# Patient Record
Sex: Female | Born: 1985
Health system: Southern US, Community
[De-identification: ages and names within clinical notes are randomized; demographics above are authoritative.]

## PROBLEM LIST (undated history)

## (undated) DIAGNOSIS — Z789 Other specified health status: Secondary | ICD-10-CM

---

## 2011-04-01 LAB — OB RESULTS CONSOLE ANTIBODY SCREEN: Antibody Screen: NEGATIVE

## 2011-04-01 LAB — OB RESULTS CONSOLE GC/CHLAMYDIA: Chlamydia: NEGATIVE

## 2011-04-01 LAB — OB RESULTS CONSOLE RPR: RPR: NONREACTIVE

## 2011-04-01 LAB — OB RESULTS CONSOLE ABO/RH: RH Type: POSITIVE

## 2011-04-01 LAB — OB RESULTS CONSOLE RUBELLA ANTIBODY, IGM: Rubella: IMMUNE

## 2011-04-08 ENCOUNTER — Other Ambulatory Visit (HOSPITAL_COMMUNITY)
Admission: RE | Admit: 2011-04-08 | Discharge: 2011-04-08 | Disposition: A | Discharge status: Home / Self Care | Payer: Medicaid Other | Source: Ambulatory Visit | Attending: Obstetrics and Gynecology | Admitting: Obstetrics and Gynecology

## 2011-04-08 DIAGNOSIS — Z01419 Encounter for gynecological examination (general) (routine) without abnormal findings: Secondary | ICD-10-CM | POA: Insufficient documentation

## 2011-07-17 LAB — OB RESULTS CONSOLE HIV ANTIBODY (ROUTINE TESTING): HIV: NONREACTIVE

## 2011-09-29 ENCOUNTER — Encounter (HOSPITAL_COMMUNITY): Payer: Self-pay | Admitting: *Deleted

## 2011-09-29 ENCOUNTER — Inpatient Hospital Stay (HOSPITAL_COMMUNITY)
Admission: AD | Admit: 2011-09-29 | Discharge: 2011-10-01 | DRG: 774 | Disposition: A | Discharge status: Home / Self Care | Payer: Medicaid Other | Source: Ambulatory Visit | Attending: Obstetrics & Gynecology | Admitting: Obstetrics & Gynecology

## 2011-09-29 ENCOUNTER — Encounter (HOSPITAL_COMMUNITY): Payer: Self-pay | Admitting: Anesthesiology

## 2011-09-29 ENCOUNTER — Inpatient Hospital Stay (HOSPITAL_COMMUNITY): Payer: Medicaid Other | Admitting: Anesthesiology

## 2011-09-29 HISTORY — DX: Other specified health status: Z78.9

## 2011-09-29 LAB — CBC
HCT: 35.6 % — ABNORMAL LOW (ref 36.0–46.0)
Hemoglobin: 11.9 g/dL — ABNORMAL LOW (ref 12.0–15.0)
MCH: 31.2 pg (ref 26.0–34.0)
MCV: 93.4 fL (ref 78.0–100.0)
RBC: 3.81 MIL/uL — ABNORMAL LOW (ref 3.87–5.11)

## 2011-09-29 MED ORDER — BENZOCAINE-MENTHOL 20-0.5 % EX AERO: 1.0000 "application " | INHALATION_SPRAY | CUTANEOUS | Status: DC | PRN

## 2011-09-29 MED ORDER — OXYTOCIN BOLUS FROM INFUSION
500.0000 mL | Freq: Once | INTRAVENOUS | Status: DC
  Filled 2011-09-29: qty 500
  Filled 2011-09-29 (×2): qty 1000

## 2011-09-29 MED ORDER — OXYTOCIN 20 UNITS IN LACTATED RINGERS INFUSION - SIMPLE: 125.0000 mL/h | Freq: Once | INTRAVENOUS | Status: DC

## 2011-09-29 MED ORDER — OXYCODONE-ACETAMINOPHEN 5-325 MG PO TABS
1.0000 | ORAL_TABLET | ORAL | Status: DC | PRN
  Administered 2011-09-29 – 2011-10-01 (×6): 1 via ORAL
  Filled 2011-09-29 (×6): qty 1

## 2011-09-29 MED ORDER — IBUPROFEN 600 MG PO TABS: 600.0000 mg | ORAL_TABLET | Freq: Four times a day (QID) | ORAL | Status: DC | PRN

## 2011-09-29 MED ORDER — SENNOSIDES-DOCUSATE SODIUM 8.6-50 MG PO TABS
2.0000 | ORAL_TABLET | Freq: Every day | ORAL | Status: DC
  Administered 2011-09-30: 2 via ORAL

## 2011-09-29 MED ORDER — EPHEDRINE 5 MG/ML INJ: 10.0000 mg | INTRAVENOUS | Status: DC | PRN

## 2011-09-29 MED ORDER — LIDOCAINE HCL (PF) 1 % IJ SOLN
30.0000 mL | INTRAMUSCULAR | Status: DC | PRN
  Filled 2011-09-29 (×2): qty 30

## 2011-09-29 MED ORDER — DIPHENHYDRAMINE HCL 50 MG/ML IJ SOLN: 12.5000 mg | INTRAMUSCULAR | Status: DC | PRN

## 2011-09-29 MED ORDER — DIPHENHYDRAMINE HCL 25 MG PO CAPS: 25.0000 mg | ORAL_CAPSULE | Freq: Four times a day (QID) | ORAL | Status: DC | PRN

## 2011-09-29 MED ORDER — PRENATAL MULTIVITAMIN CH
1.0000 | ORAL_TABLET | Freq: Every day | ORAL | Status: DC
  Administered 2011-09-29 – 2011-10-01 (×3): 1 via ORAL
  Filled 2011-09-29 (×3): qty 1

## 2011-09-29 MED ORDER — FLEET ENEMA 7-19 GM/118ML RE ENEM: 1.0000 | ENEMA | RECTAL | Status: DC | PRN

## 2011-09-29 MED ORDER — EPHEDRINE 5 MG/ML INJ
10.0000 mg | INTRAVENOUS | Status: DC | PRN
  Filled 2011-09-29: qty 4

## 2011-09-29 MED ORDER — BUTORPHANOL TARTRATE 2 MG/ML IJ SOLN: 1.0000 mg | INTRAMUSCULAR | Status: DC | PRN

## 2011-09-29 MED ORDER — SIMETHICONE 80 MG PO CHEW: 80.0000 mg | CHEWABLE_TABLET | ORAL | Status: DC | PRN

## 2011-09-29 MED ORDER — LACTATED RINGERS IV SOLN: INTRAVENOUS | Status: DC

## 2011-09-29 MED ORDER — LACTATED RINGERS IV SOLN: 500.0000 mL | INTRAVENOUS | Status: DC | PRN

## 2011-09-29 MED ORDER — LACTATED RINGERS IV SOLN: 500.0000 mL | Freq: Once | INTRAVENOUS | Status: DC

## 2011-09-29 MED ORDER — OXYCODONE-ACETAMINOPHEN 5-325 MG PO TABS: 1.0000 | ORAL_TABLET | ORAL | Status: DC | PRN

## 2011-09-29 MED ORDER — METHYLERGONOVINE MALEATE 0.2 MG/ML IJ SOLN: 0.2000 mg | INTRAMUSCULAR | Status: DC | PRN

## 2011-09-29 MED ORDER — ACETAMINOPHEN 325 MG PO TABS: 650.0000 mg | ORAL_TABLET | ORAL | Status: DC | PRN

## 2011-09-29 MED ORDER — PHENYLEPHRINE 40 MCG/ML (10ML) SYRINGE FOR IV PUSH (FOR BLOOD PRESSURE SUPPORT): 80.0000 ug | PREFILLED_SYRINGE | INTRAVENOUS | Status: DC | PRN

## 2011-09-29 MED ORDER — ONDANSETRON HCL 4 MG/2ML IJ SOLN: 4.0000 mg | Freq: Four times a day (QID) | INTRAMUSCULAR | Status: DC | PRN

## 2011-09-29 MED ORDER — METHYLERGONOVINE MALEATE 0.2 MG PO TABS: 0.2000 mg | ORAL_TABLET | ORAL | Status: DC | PRN

## 2011-09-29 MED ORDER — PHENYLEPHRINE 40 MCG/ML (10ML) SYRINGE FOR IV PUSH (FOR BLOOD PRESSURE SUPPORT)
80.0000 ug | PREFILLED_SYRINGE | INTRAVENOUS | Status: DC | PRN
  Filled 2011-09-29: qty 5

## 2011-09-29 MED ORDER — DIBUCAINE 1 % RE OINT: 1.0000 "application " | TOPICAL_OINTMENT | RECTAL | Status: DC | PRN

## 2011-09-29 MED ORDER — LIDOCAINE HCL (PF) 1 % IJ SOLN
INTRAMUSCULAR | Status: DC | PRN
  Administered 2011-09-29 (×3): 4 mL

## 2011-09-29 MED ORDER — CITRIC ACID-SODIUM CITRATE 334-500 MG/5ML PO SOLN: 30.0000 mL | ORAL | Status: DC | PRN

## 2011-09-29 MED ORDER — ZOLPIDEM TARTRATE 5 MG PO TABS: 5.0000 mg | ORAL_TABLET | Freq: Every evening | ORAL | Status: DC | PRN

## 2011-09-29 MED ORDER — TETANUS-DIPHTH-ACELL PERTUSSIS 5-2.5-18.5 LF-MCG/0.5 IM SUSP
0.5000 mL | Freq: Once | INTRAMUSCULAR | Status: AC
  Administered 2011-09-30: 0.5 mL via INTRAMUSCULAR
  Filled 2011-09-29: qty 0.5

## 2011-09-29 MED ORDER — LANOLIN HYDROUS EX OINT: TOPICAL_OINTMENT | CUTANEOUS | Status: DC | PRN

## 2011-09-29 MED ORDER — FENTANYL 2.5 MCG/ML BUPIVACAINE 1/10 % EPIDURAL INFUSION (WH - ANES)
14.0000 mL/h | INTRAMUSCULAR | Status: DC
  Administered 2011-09-29: 14 mL/h via EPIDURAL
  Filled 2011-09-29: qty 60

## 2011-09-29 MED ORDER — ONDANSETRON HCL 4 MG/2ML IJ SOLN: 4.0000 mg | INTRAMUSCULAR | Status: DC | PRN

## 2011-09-29 MED ORDER — IBUPROFEN 600 MG PO TABS
600.0000 mg | ORAL_TABLET | Freq: Four times a day (QID) | ORAL | Status: DC
  Administered 2011-09-29 – 2011-10-01 (×9): 600 mg via ORAL
  Filled 2011-09-29 (×9): qty 1

## 2011-09-29 MED ORDER — WITCH HAZEL-GLYCERIN EX PADS: 1.0000 "application " | MEDICATED_PAD | CUTANEOUS | Status: DC | PRN

## 2011-09-29 MED ORDER — ONDANSETRON HCL 4 MG PO TABS: 4.0000 mg | ORAL_TABLET | ORAL | Status: DC | PRN

## 2011-09-29 NOTE — Anesthesia Procedure Notes (Signed)
Epidural Patient location during procedure: OB Start time: 09/29/2011 5:15 AM Reason for block: procedure for pain  Staffing Performed by: anesthesiologist   Preanesthetic Checklist Completed: patient identified, site marked, surgical consent, pre-op evaluation, timeout performed, IV checked, risks and benefits discussed and monitors and equipment checked  Epidural Patient position: sitting Prep: site prepped and draped and DuraPrep Patient monitoring: continuous pulse ox and blood pressure Approach: midline Injection technique: LOR air  Needle:  Needle type: Tuohy  Needle gauge: 17 G Needle length: 9 cm Needle insertion depth: 7 cm Catheter type: closed end flexible Catheter size: 19 Gauge Catheter at skin depth: 12 cm Test dose: negative  Assessment Events: blood not aspirated, injection not painful, no injection resistance, negative IV test and no paresthesia  Additional Notes Discussed risk of headache, infection, bleeding, nerve injury and failed or incomplete block.  Patient voices understanding and wishes to proceed.

## 2011-09-29 NOTE — Anesthesia Preprocedure Evaluation (Signed)

## 2011-09-29 NOTE — Brief Op Note (Addendum)
Delivery Note At 7:08 AM a viable, healthy and  Caitlin Nichols female was delivered via Vaginal, Spontaneous Delivery (Presentation: Middle Occiput Anterior).  APGAR: 5, 7; weight 6 lb 4 oz (2835 g).   Placenta status: Intact, Expressed.as the cord avulsed and required manual removal and this was done easily under the CLE - tolerated well- in spite of the fact that the uterus involuted immediately  Cord: 3 vessels with the following complications: None.  Cord pH: pending  Anesthesia: Epidural  Episiotomy: no Lacerations: abrasion of the left periurethral area Suture Repair: n/a Est. Blood Loss (mL): 300  Mom to postpartum.  Baby to with mom.  [name] H. 09/29/2011, 7:53 AM     The neonatal team was called to the delivery per Dr Eric Form and without intervention the baby started breathing on its own

## 2011-09-29 NOTE — MAU Note (Signed)
Pt arrives via wheelchair, very uncomfortable.

## 2011-09-29 NOTE — H&P (Signed)
Caitlin Nichols is a 26 y.o. female presenting for active labor and I checked her and she was anterior lip and 100% effaced and 0 station and AROM revealed clear fluid Maternal Medical History:  Reason for admission: Reason for admission: contractions.  Contractions: Onset was 3-5 hours ago.   Frequency: regular.   Duration is approximately 50 seconds.   Perceived severity is strong.    Fetal activity: Perceived fetal activity is normal.   Last perceived fetal movement was within the past 12 hours.    Prenatal complications: no prenatal complications Prenatal Complications - Diabetes: none.    OB History    Grav Para Term Preterm Abortions TAB SAB Ect Mult Living   2 1 1  0 0 0 0 0 0 1     Past Medical History  Diagnosis Date  . No pertinent past medical history    History reviewed. No pertinent past surgical history. Family History: family history is not on file. Social History:  reports that she has never smoked. She has never used smokeless tobacco. She reports that she does not drink alcohol or use illicit drugs.  Review of Systems  Constitutional: Negative.  Negative for fever and chills.  HENT: Negative.   Eyes: Negative.   Respiratory: Negative.  Negative for shortness of breath.   Cardiovascular: Negative.  Negative for chest pain and palpitations.  Gastrointestinal: Negative.  Negative for vomiting and diarrhea.  Genitourinary: Negative.   Musculoskeletal: Negative.  Negative for joint pain.  Skin: Negative.   Neurological: Negative.  Negative for headaches.  Endo/Heme/Allergies: Negative.   Psychiatric/Behavioral: Negative.     Dilation: Lip/rim Effacement (%): 100 Station: +1;0 Exam by:: DuPont, RN  Blood pressure 113/65, pulse 67, temperature 97.5 F (36.4 C), temperature source Oral, resp. rate 18, height 5\' 6"  (1.676 m), weight 95.255 kg (210 lb). Maternal Exam:  Uterine Assessment: Contraction strength is firm.  Contraction duration is 45 seconds.  Contraction frequency is regular.   Abdomen: Patient reports no abdominal tenderness. Fundal height is term.   Estimated fetal weight is 7#.   Fetal presentation: vertex  Introitus: Normal vulva. Normal vagina.  Ferning test: not done.  Nitrazine test: not done. Amniotic fluid character: clear.  Pelvis: adequate for delivery.      Physical Exam  Vitals reviewed. Constitutional: She is oriented to person, place, and time. She appears well-developed and well-nourished.  HENT:  Head: Normocephalic and atraumatic.  Mouth/Throat: Oropharynx is clear and moist.  Eyes: Pupils are equal, round, and reactive to light. Right eye exhibits no discharge. Left eye exhibits no discharge.  Neck: Normal range of motion. Neck supple.  Cardiovascular: Normal rate and regular rhythm.   Respiratory: Effort normal and breath sounds normal.  GI: Soft. Bowel sounds are normal.  Genitourinary: Vagina normal and uterus normal.  Musculoskeletal: Normal range of motion. She exhibits no edema.  Neurological: She is alert and oriented to person, place, and time. She has normal reflexes.  Skin: Skin is warm.  Psychiatric: She has a normal mood and affect. Her behavior is normal. Thought content normal.    Prenatal labs: ABO, Rh: O/Positive/-- (10/23 0000) Antibody: Negative (10/23 0000) Rubella: Immune (10/23 0000) RPR: Nonreactive (02/07 0000)  HBsAg: Negative (10/23 0000)  HIV: Non-reactive (02/07 0000)  GBS: Negative (03/28 0000)   Assessment/Plan: IUP term in labor39 weeks  expectant management Shaun Runyon H. 09/29/2011, 6:55 AM

## 2011-09-29 NOTE — Anesthesia Postprocedure Evaluation (Signed)
  Anesthesia Post-op Note  Patient: Caitlin Nichols  Procedure(s) Performed: * No procedures listed *  Patient Location: 130  Anesthesia Type: Epidural  Level of Consciousness: awake, alert  and oriented  Airway and Oxygen Therapy: Patient Spontanous Breathing  Post-op Pain: none  Post-op Assessment: Post-op Vital signs reviewed, Patient's Cardiovascular Status Stable, No headache, No backache, No residual numbness and No residual motor weakness  Post-op Vital Signs: Reviewed and stable  Complications: No apparent anesthesia complications

## 2011-09-30 ENCOUNTER — Encounter (HOSPITAL_COMMUNITY): Payer: Self-pay | Admitting: *Deleted

## 2011-09-30 NOTE — Progress Notes (Signed)
UR Chart review completed.  

## 2011-09-30 NOTE — Progress Notes (Signed)
Post Partum Day 1 Subjective: no complaints, up ad lib, voiding and tolerating PO  Objective: Blood pressure 122/83, pulse 76, temperature 98.3 F (36.8 C), temperature source Oral, resp. rate 20, height 5\' 6"  (1.676 m), weight 95.255 kg (210 lb).  Physical Exam:  General: alert, cooperative and no distress Lochia: appropriate Uterine Fundus: firm Incision: n/A DVT Evaluation: No evidence of DVT seen on physical exam. Negative Homan's sign. No cords or calf tenderness.   Basename 09/29/11 0455  HGB 11.9*  HCT 35.6*    Assessment/Plan: Plan for discharge tomorrow   LOS: 1 day   Caitlin Nichols H. 09/30/2011, 8:23 AM

## 2011-10-01 NOTE — Progress Notes (Signed)
Post Partum Day2 Subjective: no complaints, up ad lib, voiding and tolerating PO Not breast feeding Objective: Blood pressure 113/75, pulse 61, temperature 98.6 F (37 C), temperature source Oral, resp. rate 18, height 5\' 6"  (1.676 m), weight 95.255 kg (210 lb), SpO2 99.00%, unknown if currently breastfeeding.  Physical Exam:  General: alert, cooperative and no distress Lochia: appropriate Uterine Fundus: firm Incision: n/a DVT Evaluation: No evidence of DVT seen on physical exam. Negative Homan's sign. No cords or calf tenderness. No significant calf/ankle edema.   Basename 09/29/11 0455  HGB 11.9*  HCT 35.6*    Assessment/Plan: Discharge home   LOS: 2 days  F/u 2 weeks Caitlin Nichols H. 10/01/2011, 11:37 AM

## 2011-10-01 NOTE — Discharge Summary (Signed)
Obstetric Discharge Summary Reason for Admission: onset of labor Prenatal Procedures: none Intrapartum Procedures: spontaneous vaginal delivery Postpartum Procedures: none Complications-Operative and Postpartum: none there was manual removal of the placenta Hemoglobin  Date Value Range Status  09/29/2011 11.9* 12.0-15.0 (g/dL) Final     HCT  Date Value Range Status  09/29/2011 35.6* 36.0-46.0 (%) Final      Discharge Diagnoses: Term Pregnancy-delivered  Discharge Information: Date: 10/01/2011 Activity: unrestricted Diet: routine Medications: None and pt may take tylenol and ibuprofen or motrin prn Condition: stable Instructions: refer to practice specific booklet Discharge to: home   Newborn Data: Live born female  Birth Weight: 6 lb 4 oz (2835 g) APGAR: 5, 7 Post Partum Day2 Subjective: no complaints, up ad lib, voiding and tolerating PO Not breast feeding Objective: Blood pressure 113/75, pulse 61, temperature 98.6 F (37 C), temperature source Oral, resp. rate 18, height 5\' 6"  (1.676 m), weight 95.255 kg (210 lb), SpO2 99.00%, unknown if currently breastfeeding.  Physical Exam:  General: alert, cooperative and no distress Lochia: appropriate Uterine Fundus: firm Incision: n/a DVT Evaluation: No evidence of DVT seen on physical exam. Negative Homan's sign. No cords or calf tenderness. No significant calf/ankle edema.   Basename 09/29/11 0455  HGB 11.9*  HCT 35.6*    Assessment/Plan: Discharge home   LOS: 2 days  F/u 2 weeks Avrianna Smart H. 10/01/2011, 11:37 AM      Home with mother.  Alishba Naples H. 10/01/2011, 11:39 AM

## 2011-11-13 ENCOUNTER — Other Ambulatory Visit (HOSPITAL_COMMUNITY)
Admission: RE | Admit: 2011-11-13 | Discharge: 2011-11-13 | Disposition: A | Discharge status: Home / Self Care | Payer: Medicaid Other | Source: Ambulatory Visit | Attending: Obstetrics & Gynecology | Admitting: Obstetrics & Gynecology

## 2011-11-13 DIAGNOSIS — Z124 Encounter for screening for malignant neoplasm of cervix: Secondary | ICD-10-CM | POA: Insufficient documentation

## 2014-01-27 ENCOUNTER — Emergency Department (HOSPITAL_BASED_OUTPATIENT_CLINIC_OR_DEPARTMENT_OTHER): Payer: Medicaid Other

## 2014-01-27 ENCOUNTER — Encounter (HOSPITAL_BASED_OUTPATIENT_CLINIC_OR_DEPARTMENT_OTHER): Payer: Self-pay | Admitting: Emergency Medicine

## 2014-01-27 ENCOUNTER — Emergency Department (HOSPITAL_BASED_OUTPATIENT_CLINIC_OR_DEPARTMENT_OTHER)
Admission: EM | Admit: 2014-01-27 | Discharge: 2014-01-27 | Disposition: A | Discharge status: Home / Self Care | Payer: Medicaid Other | Attending: Emergency Medicine | Admitting: Emergency Medicine

## 2014-01-27 DIAGNOSIS — R05 Cough: Secondary | ICD-10-CM | POA: Insufficient documentation

## 2014-01-27 DIAGNOSIS — R000 Tachycardia, unspecified: Secondary | ICD-10-CM | POA: Insufficient documentation

## 2014-01-27 DIAGNOSIS — R059 Cough, unspecified: Secondary | ICD-10-CM | POA: Insufficient documentation

## 2014-01-27 DIAGNOSIS — J159 Unspecified bacterial pneumonia: Secondary | ICD-10-CM | POA: Insufficient documentation

## 2014-01-27 DIAGNOSIS — J189 Pneumonia, unspecified organism: Secondary | ICD-10-CM

## 2014-01-27 DIAGNOSIS — Z3202 Encounter for pregnancy test, result negative: Secondary | ICD-10-CM | POA: Insufficient documentation

## 2014-01-27 LAB — PREGNANCY, URINE: Preg Test, Ur: NEGATIVE

## 2014-01-27 MED ORDER — AZITHROMYCIN 250 MG PO TABS: ORAL_TABLET | ORAL | Status: DC

## 2014-01-27 MED ORDER — ACETAMINOPHEN 500 MG PO TABS
1000.0000 mg | ORAL_TABLET | Freq: Once | ORAL | Status: AC
  Administered 2014-01-27: 1000 mg via ORAL
  Filled 2014-01-27: qty 2

## 2014-01-27 MED ORDER — ONDANSETRON 4 MG PO TBDP
4.0000 mg | ORAL_TABLET | Freq: Once | ORAL | Status: AC
  Administered 2014-01-27: 4 mg via ORAL
  Filled 2014-01-27: qty 1

## 2014-01-27 NOTE — Discharge Instructions (Signed)

## 2014-01-27 NOTE — ED Notes (Signed)
MD at bedside. 

## 2014-01-27 NOTE — ED Notes (Signed)
C/o fever, chills, cough, sweats, headache x 1 week

## 2014-01-27 NOTE — ED Provider Notes (Signed)
CSN: [id number]     Arrival date & time 01/27/14  1140 History   First MD Initiated Contact with Patient 01/27/14 1157     Chief Complaint  Patient presents with  . Cough     (Consider location/radiation/quality/duration/timing/severity/associated sxs/prior Treatment) Patient is a 28 y.o. female presenting with cough.  Cough Cough characteristics:  Productive Sputum characteristics:  Yellow Severity:  Moderate Onset quality:  Gradual Duration:  1 week Timing:  Constant Progression:  Worsening Chronicity:  New Smoker: yes   Context: upper respiratory infection   Relieved by:  Nothing Worsened by:  Nothing tried Ineffective treatments:  None tried Associated symptoms: chills, fever and shortness of breath   Associated symptoms: no chest pain, no rash, no rhinorrhea and no sore throat     Past Medical History  Diagnosis Date  . No pertinent past medical history    History reviewed. No pertinent past surgical history. No family history on file. History  Substance Use Topics  . Smoking status: Never Smoker   . Smokeless tobacco: Never Used  . Alcohol Use: No   OB History   Grav Para Term Preterm Abortions TAB SAB Ect Mult Living   2 2 2  0 0 0 0 0 0 2     Review of Systems  Constitutional: Positive for fever and chills.  HENT: Negative for congestion, rhinorrhea and sore throat.   Eyes: Negative for photophobia and visual disturbance.  Respiratory: Positive for cough and shortness of breath.   Cardiovascular: Negative for chest pain and leg swelling.  Gastrointestinal: Negative for nausea, vomiting, abdominal pain, diarrhea and constipation.  Endocrine: Negative for polyphagia and polyuria.  Genitourinary: Negative for dysuria, flank pain, vaginal bleeding, vaginal discharge and enuresis.  Musculoskeletal: Negative for back pain and gait problem.  Skin: Negative for color change and rash.  Neurological: Negative for dizziness, syncope, light-headedness and numbness.   Hematological: Negative for adenopathy. Does not bruise/bleed easily.  All other systems reviewed and are negative.     Allergies  Review of patient's allergies indicates no known allergies.  Home Medications   Prior to Admission medications   Medication Sig Start Date End Date Taking? Authorizing Provider  azithromycin (ZITHROMAX Z-PAK) 250 MG tablet Take 2 tablets by mouth on day 1 followed by 1 pill by mouth daily for 4 days for a total of 5 days 01/27/14   Mirian Mo, MD   BP 120/68  Pulse 100  Temp(Src) 98.4 F (36.9 C) (Oral)  Resp 16  Ht 5\' 8"  (1.727 m)  Wt 180 lb (81.647 kg)  BMI 27.38 kg/m2  SpO2 98%  LMP 01/08/2014 Physical Exam  Vitals reviewed. Constitutional: She is oriented to person, place, and time. She appears well-developed and well-nourished.  HENT:  Head: Normocephalic and atraumatic.  Right Ear: External ear normal.  Left Ear: External ear normal.  Eyes: Conjunctivae and EOM are normal. Pupils are equal, round, and reactive to light.  Neck: Normal range of motion. Neck supple.  Cardiovascular: Normal rate, regular rhythm, normal heart sounds and intact distal pulses.   Pulmonary/Chest: Effort normal. She has rales in the right lower field and the left lower field.  Abdominal: Soft. Bowel sounds are normal. There is no tenderness.  Musculoskeletal: Normal range of motion.  Neurological: She is alert and oriented to person, place, and time.  Skin: Skin is warm and dry.    ED Course  Procedures (including critical care time) Labs Review Labs Reviewed  PREGNANCY, URINE    Imaging  Review Dg Chest 2 View  01/27/2014   CLINICAL DATA:  Persistent cough  EXAM: CHEST  2 VIEW  COMPARISON:  None.  FINDINGS: Normal cardiac silhouette. There is a right lung lobe opacity projecting over the cardiophrenic angle which projects over the right middle lobe on lateral projection. No pleural fluid. No pneumothorax.  IMPRESSION: Right middle lobe pneumonia.    Electronically Signed   By: Genevive BiStewart  Edmunds M.D.   On: 01/27/2014 12:32     EKG Interpretation None      MDM   Final diagnoses:  Community acquired pneumonia    28 y.o. female  without pertinent PMH presents with cough, subjective fever times one week.  Patient is a productive yellow sputum. On arrival the patient is very mildly tachycardic on my examination and has a physical exam and other vitals as above. Chest x-ray obtained and was positive for right middle lobe pneumonia. Patient tolerated by mouth and not requiring oxygen, subcutaneous her stable to discharge home with outpatient therapy for pneumonia. She was given standard return precautions, voiced understanding, and will followup.    Labs and imaging as above reviewed.   1. Community acquired pneumonia         Mirian MoMatthew Gentry, MD 01/27/14 1326

## 2014-04-10 ENCOUNTER — Encounter (HOSPITAL_BASED_OUTPATIENT_CLINIC_OR_DEPARTMENT_OTHER): Payer: Self-pay | Admitting: Emergency Medicine

## 2017-03-02 ENCOUNTER — Emergency Department (HOSPITAL_BASED_OUTPATIENT_CLINIC_OR_DEPARTMENT_OTHER)
Admission: EM | Admit: 2017-03-02 | Discharge: 2017-03-02 | Disposition: A | Discharge status: Home / Self Care | Payer: No Typology Code available for payment source | Attending: Emergency Medicine | Admitting: Emergency Medicine

## 2017-03-02 ENCOUNTER — Encounter (HOSPITAL_BASED_OUTPATIENT_CLINIC_OR_DEPARTMENT_OTHER): Payer: Self-pay

## 2017-03-02 ENCOUNTER — Emergency Department (HOSPITAL_BASED_OUTPATIENT_CLINIC_OR_DEPARTMENT_OTHER): Payer: No Typology Code available for payment source

## 2017-03-02 DIAGNOSIS — S161XXA Strain of muscle, fascia and tendon at neck level, initial encounter: Secondary | ICD-10-CM | POA: Insufficient documentation

## 2017-03-02 DIAGNOSIS — Y92481 Parking lot as the place of occurrence of the external cause: Secondary | ICD-10-CM | POA: Insufficient documentation

## 2017-03-02 DIAGNOSIS — Y999 Unspecified external cause status: Secondary | ICD-10-CM | POA: Diagnosis not present

## 2017-03-02 DIAGNOSIS — Y9389 Activity, other specified: Secondary | ICD-10-CM | POA: Insufficient documentation

## 2017-03-02 DIAGNOSIS — S199XXA Unspecified injury of neck, initial encounter: Secondary | ICD-10-CM | POA: Diagnosis present

## 2017-03-02 MED ORDER — CYCLOBENZAPRINE HCL 10 MG PO TABS: 10.0000 mg | ORAL_TABLET | Freq: Every day | ORAL | 0 refills | Status: DC

## 2017-03-02 MED ORDER — TRAMADOL HCL 50 MG PO TABS: 50.0000 mg | ORAL_TABLET | Freq: Four times a day (QID) | ORAL | 0 refills | Status: DC | PRN

## 2017-03-02 MED ORDER — IBUPROFEN 800 MG PO TABS: 800.0000 mg | ORAL_TABLET | Freq: Three times a day (TID) | ORAL | 0 refills | Status: DC | PRN
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
## 2017-03-02 NOTE — Discharge Instructions (Signed)
Return here as needed. Follow up with a primary doctor. Use ice and heat on your neck and shoulders. Your x-rays did not show any significant abnormalities
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
## 2018-12-27 ENCOUNTER — Encounter (HOSPITAL_BASED_OUTPATIENT_CLINIC_OR_DEPARTMENT_OTHER): Payer: Self-pay

## 2018-12-27 ENCOUNTER — Emergency Department (HOSPITAL_BASED_OUTPATIENT_CLINIC_OR_DEPARTMENT_OTHER)
Admission: EM | Admit: 2018-12-27 | Discharge: 2018-12-27 | Disposition: A | Discharge status: Home / Self Care | Payer: Self-pay | Attending: Emergency Medicine | Admitting: Emergency Medicine

## 2018-12-27 ENCOUNTER — Other Ambulatory Visit: Payer: Self-pay

## 2018-12-27 DIAGNOSIS — G501 Atypical facial pain: Secondary | ICD-10-CM | POA: Insufficient documentation

## 2018-12-27 DIAGNOSIS — R220 Localized swelling, mass and lump, head: Secondary | ICD-10-CM | POA: Insufficient documentation

## 2018-12-27 DIAGNOSIS — F17210 Nicotine dependence, cigarettes, uncomplicated: Secondary | ICD-10-CM | POA: Insufficient documentation

## 2018-12-27 LAB — GROUP A STREP BY PCR: Group A Strep by PCR: NOT DETECTED

## 2018-12-27 MED ORDER — AMOXICILLIN 500 MG PO CAPS
500.0000 mg | ORAL_CAPSULE | Freq: Three times a day (TID) | ORAL | 0 refills | Status: DC
Start: 2018-12-27 — End: 2023-12-18

## 2018-12-27 MED FILL — AMOXICILLIN 500 MG CAPSULE: 500 | 7 days supply | Qty: 21 | Fill #0

## 2018-12-27 NOTE — ED Triage Notes (Addendum)
Pt c/o sore throat and feels right side/jaw is "locking" x today-NAD-steady gait

## 2018-12-27 NOTE — ED Provider Notes (Signed)
[facility] EMERGENCY DEPARTMENT Provider Note   CSN: [id number] Arrival date & time: 12/27/18  1423    History   Chief Complaint Chief Complaint  Patient presents with  . Sore Throat    HPI Caitlin Nichols is a 33 y.o. female with no significant pertinent past medical history who presents today for evaluation of right-sided throat/face pain.  She reports that this morning when she woke up she had pain on the right side.  She reports pain with swallowing however denies difficulty swallowing or breathing.  No fevers.  No sick contacts.  She denies chest pain, cough, or shortness of breath.  She reports that she does have a broken right mandibular third molar, is supposed to get it removed by her dentist in approximately 3 to 4 weeks.     HPI  Past Medical History:  Diagnosis Date  . No pertinent past medical history     There are no active problems to display for this patient.   History reviewed. No pertinent surgical history.   OB History    Gravida  2   Para  2   Term  2   Preterm  0   AB  0   Living  2     SAB  0   TAB  0   Ectopic  0   Multiple  0   Live Births  2            Home Medications    Prior to Admission medications   Medication Sig Start Date End Date Taking? Authorizing Provider  amoxicillin (AMOXIL) 500 MG capsule Take 1 capsule (500 mg total) by mouth 3 (three) times daily. 12/27/18   Cristina GongHammond, Natallie Ravenscroft W, PA-C  ibuprofen (ADVIL,MOTRIN) 800 MG tablet Take 1 tablet (800 mg total) by mouth every 8 (eight) hours as needed. 03/02/17   Charlestine NightLawyer, Christopher, PA-C    Family History No family history on file.  Social History Social History   Tobacco Use  . Smoking status: Current Every Day Smoker    Types: Cigarettes  . Smokeless tobacco: Never Used  Substance Use Topics  . Alcohol use: Yes    Comment: weekly  . Drug use: No     Allergies   Patient has no known allergies.   Review of Systems Review of Systems   Constitutional: Negative for chills and fever.  HENT: Positive for dental problem, facial swelling and sore throat. Negative for drooling, postnasal drip, rhinorrhea, sinus pressure, sinus pain, trouble swallowing and voice change.   Respiratory: Negative for chest tightness and shortness of breath.   Neurological: Negative for facial asymmetry.  All other systems reviewed and are negative.    Physical Exam Updated Vital Signs BP 119/78 (BP Location: Left Arm)   Pulse (!) 103   Temp 98.3 F (36.8 C) (Oral)   Resp 18   Ht 5\' 9"  (1.753 m)   Wt 75.3 kg   LMP 12/11/2018   SpO2 100%   BMI 24.51 kg/m   Physical Exam Vitals signs and nursing note reviewed.  Constitutional:      General: She is not in acute distress.    Appearance: She is not ill-appearing.  HENT:     Head: Normocephalic.     Comments: There is mild edema along the angle of the mandible.  She has a broken third mandibular molar without obvious intraoral swelling or dental abscess.  She does not have significant swelling under the jaw or elevation of the  floor the mouth.  Voice is not muffled.  There is no trismus or pain with opening and closing mouth.    Right Ear: Tympanic membrane and ear canal normal.     Left Ear: Tympanic membrane and ear canal normal.     Nose: No congestion.     Mouth/Throat:     Mouth: Mucous membranes are moist.     Pharynx: Uvula midline. No pharyngeal swelling.     Tonsils: No tonsillar exudate or tonsillar abscesses. 1+ on the right. 1+ on the left.  Eyes:     Conjunctiva/sclera: Conjunctivae normal.  Neck:     Musculoskeletal: Normal range of motion and neck supple.  Cardiovascular:     Rate and Rhythm: Normal rate.  Pulmonary:     Effort: Pulmonary effort is normal. No respiratory distress.  Lymphadenopathy:     Cervical: No cervical adenopathy.  Skin:    General: Skin is warm and dry.  Neurological:     Mental Status: She is alert.  Psychiatric:        Mood and Affect:  Mood normal.        Behavior: Behavior normal.      ED Treatments / Results  Labs (all labs ordered are listed, but only abnormal results are displayed) Labs Reviewed  GROUP A STREP BY PCR    EKG None  Radiology No results found.  Procedures Procedures (including critical care time)  Medications Ordered in ED Medications - No data to display   Initial Impression / Assessment and Plan / ED Course  I have reviewed the triage vital signs and the nursing notes.  Pertinent labs & imaging results that were available during my care of the patient were reviewed by me and considered in my medical decision making (see chart for details).  Clinical Course as of Dec 27 1546  Mon Dec 27, 2018  1538 HR 75   [EH]    Clinical Course User Index [EH] Lorin Glass, [place]      Patient presents today for evaluation of right-sided facial pain and swelling along with pain with swallowing.  Rapid strep was obtained which was negative.  No evidence of tonsillitis or tonsillar abscess.  She has a broken third mandibular molar and mild facial swelling without evidence of deep spread of infection or Ludwig's angina.  She is able to swallow without difficulty.  She has a Pharmacist, community.  Recommended dental follow-up.  We will give prescription for amoxicillin in addition to instructions on OTC pain medicine.  She denies possibility of pregnancy.   Return precautions were discussed with patient who states their understanding.  At the time of discharge patient denied any unaddressed complaints or concerns.  Patient is agreeable for discharge home.   Final Clinical Impressions(s) / ED Diagnoses   Final diagnoses:  Facial swelling    ED Discharge Orders         Ordered    amoxicillin (AMOXIL) 500 MG capsule  3 times daily     12/27/18 1542           Lorin Glass, [place] 12/27/18 1548    Malvin Johns, MD 12/27/18 2244

## 2018-12-27 NOTE — Discharge Instructions (Addendum)
You may have diarrhea from the antibiotics.  It is very important that you continue to take the antibiotics even if you get diarrhea unless a medical professional tells you that you may stop taking them.  If you stop too early the bacteria you are being treated for will become stronger and you may need different, more powerful antibiotics that have more side effects and worsening diarrhea.  Please stay well hydrated and consider probiotics as they may decrease the severity of your diarrhea.  Please be aware that if you take any hormonal contraception (birth control pills, nexplanon, the ring, etc) that your birth control will not work while you are taking antibiotics and you need to use back up protection as directed on the birth control medication information insert.  ° °Please take Ibuprofen (Advil, motrin) and Tylenol (acetaminophen) to relieve your pain.  You may take up to 600 MG (3 pills) of normal strength ibuprofen every 8 hours as needed.  In between doses of ibuprofen you make take tylenol, up to 1,000 mg (two extra strength pills).  Do not take more than 3,000 mg tylenol in a 24 hour period.  Please check all medication labels as many medications such as pain and cold medications may contain tylenol.  Do not drink alcohol while taking these medications.  Do not take other NSAID'S while taking ibuprofen (such as aleve or naproxen).  Please take ibuprofen with food to decrease stomach upset. ° °

## 2020-03-27 ENCOUNTER — Other Ambulatory Visit: Payer: Self-pay

## 2020-03-27 ENCOUNTER — Encounter (HOSPITAL_BASED_OUTPATIENT_CLINIC_OR_DEPARTMENT_OTHER): Payer: Self-pay | Admitting: Emergency Medicine

## 2020-03-27 ENCOUNTER — Emergency Department (HOSPITAL_BASED_OUTPATIENT_CLINIC_OR_DEPARTMENT_OTHER)
Admission: EM | Admit: 2020-03-27 | Discharge: 2020-03-27 | Disposition: A | Discharge status: Home / Self Care | Payer: Self-pay | Attending: Emergency Medicine | Admitting: Emergency Medicine

## 2020-03-27 ENCOUNTER — Emergency Department (HOSPITAL_BASED_OUTPATIENT_CLINIC_OR_DEPARTMENT_OTHER): Payer: Self-pay

## 2020-03-27 DIAGNOSIS — F17210 Nicotine dependence, cigarettes, uncomplicated: Secondary | ICD-10-CM | POA: Insufficient documentation

## 2020-03-27 DIAGNOSIS — M25531 Pain in right wrist: Secondary | ICD-10-CM | POA: Insufficient documentation

## 2020-03-27 MED ORDER — IBUPROFEN 800 MG PO TABS: 800.0000 mg | ORAL_TABLET | Freq: Three times a day (TID) | ORAL | 0 refills | Status: DC | PRN

## 2020-03-27 MED ORDER — PREDNISONE 20 MG PO TABS: 60.0000 mg | ORAL_TABLET | Freq: Every day | ORAL | 0 refills | Status: DC

## 2020-03-27 MED ORDER — IBUPROFEN 400 MG PO TABS
600.0000 mg | ORAL_TABLET | Freq: Once | ORAL | Status: AC
  Administered 2020-03-27: 600 mg via ORAL
  Filled 2020-03-27: qty 1

## 2020-03-27 MED ORDER — PREDNISONE 10 MG PO TABS
60.0000 mg | ORAL_TABLET | Freq: Once | ORAL | Status: AC
  Administered 2020-03-27: 60 mg via ORAL
  Filled 2020-03-27: qty 1

## 2020-03-27 MED FILL — IBUPROFEN 800 MG TAB: 800 | 7 days supply | Qty: 21 | Fill #0

## 2020-03-27 MED FILL — predniSONE 20 MG TABS: 20 | 4 days supply | Qty: 12 | Fill #0

## 2020-03-27 NOTE — ED Provider Notes (Signed)
[facility] EMERGENCY DEPARTMENT Provider Note   CSN: [id number] Arrival date & time: 03/27/20  0631     History Chief Complaint  Patient presents with  . Hand Problem    Caitlin Nichols is a 34 y.o. female.  She has no significant past medical history.  She is here with complaint of on and off right hand and wrist pain it has been going on for a year.  This is the worst of the event.  Swelling, increased pain with movement.  Radiates up her arm to her elbow.  She usually soaks in warm water with some improvement.  Sometimes happens in her left hand.  No trauma.  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  The history is provided by the patient.  Wrist Pain This is a chronic problem. Episode onset: 1 year. Episode frequency: randomly. The problem has been gradually worsening. Pertinent negatives include no chest pain, no abdominal pain, no headaches and no shortness of breath. The symptoms are aggravated by bending and twisting. Nothing relieves the symptoms. She has tried rest for the symptoms. The treatment provided no relief.       Past Medical History:  Diagnosis Date  . No pertinent past medical history     There are no problems to display for this patient.   History reviewed. No pertinent surgical history.   OB History    Gravida  2   Para  2   Term  2   Preterm  0   AB  0   Living  2     SAB  0   TAB  0   Ectopic  0   Multiple  0   Live Births  2           No family history on file.  Social History   Tobacco Use  . Smoking status: Current Every Day Smoker    Types: Cigarettes  . Smokeless tobacco: Never Used  Vaping Use  . Vaping Use: Never used  Substance Use Topics  . Alcohol use: Yes    Comment: weekly  . Drug use: No    Home Medications Prior to Admission medications   Medication Sig Start Date End Date Taking? Authorizing Provider  amoxicillin (AMOXIL) 500 MG capsule Take 1  capsule (500 mg total) by mouth 3 (three) times daily. 12/27/18   Cristina Gong, PA-C  ibuprofen (ADVIL,MOTRIN) 800 MG tablet Take 1 tablet (800 mg total) by mouth every 8 (eight) hours as needed. 03/02/17   Charlestine Night, PA-C    Allergies    Patient has no known allergies.  Review of Systems   Review of Systems  Respiratory: Negative for shortness of breath.   Cardiovascular: Negative for chest pain.  Gastrointestinal: Negative for abdominal pain.  Skin: Negative for rash and wound.  Neurological: Negative for headaches.    Physical Exam Updated Vital Signs BP (!) 128/92   Pulse 86   Temp 98.4 F (36.9 C) (Oral)   Resp 16   Ht 5\' 9"  (1.753 m)   Wt 75.3 kg   SpO2 96%   BMI 24.51 kg/m   Physical Exam Constitutional:      Appearance: She is well-developed.  HENT:     Head: Normocephalic and atraumatic.  Eyes:     Conjunctiva/sclera: Conjunctivae normal.  Musculoskeletal:        General: Swelling and tenderness present.     Cervical  back: Neck supple.     Comments: Right upper extremity full range of motion of shoulder and elbow.  Right wrist diffusely tender over the dorsum and has some swelling extending over the dorsum of hand.  Metacarpal and IP joints nontender.  No snuffbox tenderness.  No open wounds.  Radial pulse 2+.  Distal sensation motor cap refill intact.  Skin:    General: Skin is warm and dry.  Neurological:     General: No focal deficit present.     Mental Status: She is alert.     GCS: GCS eye subscore is 4. GCS verbal subscore is 5. GCS motor subscore is 6.     ED Results / Procedures / Treatments   Labs (all labs ordered are listed, but only abnormal results are displayed) Labs Reviewed - No data to display  EKG None  Radiology DG Wrist Complete Right  Result Date: 03/27/2020 CLINICAL DATA:  Wrist pain.  Swelling. EXAM: RIGHT WRIST - COMPLETE 3+ VIEW COMPARISON:  Right finger series 04/11/2015. FINDINGS: No acute bony or joint  abnormality identified. No evidence of fracture dislocation. No radiopaque foreign body. IMPRESSION: No acute abnormality. Electronically Signed   By: Maisie Fus  Register   On: 03/27/2020 07:48    Procedures Procedures (including critical care time)  Medications Ordered in ED Medications  ibuprofen (ADVIL) tablet 600 mg (has no administration in time range)  predniSONE (DELTASONE) tablet 60 mg (has no administration in time range)    ED Course  I have reviewed the triage vital signs and the nursing notes.  Pertinent labs & imaging results that were available during my care of the patient were reviewed by me and considered in my medical decision making (see chart for details).  Clinical Course as of Mar 27 1699  Tue Mar 27, 2020  0932 X-ray interpreted by me as no acute fracture or dislocation.  No obvious inflammatory changes.   [MB]    Clinical Course User Index [MB] Terrilee Files, MD   MDM Rules/Calculators/A&P                         34 year old female here with right hand/wrist pain and swelling on and off for a year.  Differential includes arthritis, tenosynovitis, gout, inflammatory arthritis, occult fracture  Final Clinical Impression(s) / ED Diagnoses Final diagnoses:  Right wrist pain    Rx / DC Orders ED Discharge Orders         Ordered    ibuprofen (ADVIL) 800 MG tablet  Every 8 hours PRN        03/27/20 0759    predniSONE (DELTASONE) 20 MG tablet  Daily        03/27/20 0759           Terrilee Files, MD 03/27/20 1700

## 2020-03-27 NOTE — ED Notes (Signed)
Splint applied to R wrist, pt states decrease pain with R wrist with splint.

## 2020-03-27 NOTE — Discharge Instructions (Addendum)
You were seen in the emergency department for right wrist pain and swelling.  You had an x-ray that did not show any obvious fracture.  There is likely inflammation in the joint that is causing your pain.  We are prescribing you ibuprofen 3 times a day and prednisone for 4 more days.  Wrist splint for comfort.  If your symptoms are not improving please schedule a follow-up appointment with Dr. Amanda Pea, hand surgeon.

## 2020-03-27 NOTE — ED Notes (Signed)
Review D/C papers with pt, pt states understanding, pt denies questions at this time. 

## 2020-03-27 NOTE — ED Triage Notes (Signed)
Pt has swelling to right hand. Pt states it has been intermittent x 1 year and usually improves after soaking in warm water.

## 2020-07-30 ENCOUNTER — Encounter (HOSPITAL_BASED_OUTPATIENT_CLINIC_OR_DEPARTMENT_OTHER): Payer: Self-pay

## 2020-07-30 ENCOUNTER — Emergency Department (HOSPITAL_BASED_OUTPATIENT_CLINIC_OR_DEPARTMENT_OTHER)
Admission: EM | Admit: 2020-07-30 | Discharge: 2020-07-30 | Disposition: A | Discharge status: Home / Self Care | Payer: Self-pay | Attending: Emergency Medicine | Admitting: Emergency Medicine

## 2020-07-30 ENCOUNTER — Other Ambulatory Visit: Payer: Self-pay

## 2020-07-30 ENCOUNTER — Emergency Department (HOSPITAL_BASED_OUTPATIENT_CLINIC_OR_DEPARTMENT_OTHER): Payer: Self-pay

## 2020-07-30 ENCOUNTER — Other Ambulatory Visit (HOSPITAL_COMMUNITY): Payer: Self-pay | Admitting: Emergency Medicine

## 2020-07-30 DIAGNOSIS — M79641 Pain in right hand: Secondary | ICD-10-CM

## 2020-07-30 DIAGNOSIS — F17210 Nicotine dependence, cigarettes, uncomplicated: Secondary | ICD-10-CM | POA: Insufficient documentation

## 2020-07-30 MED ORDER — IBUPROFEN 800 MG PO TABS
800.0000 mg | ORAL_TABLET | Freq: Once | ORAL | Status: AC
  Administered 2020-07-30: 800 mg via ORAL
  Filled 2020-07-30: qty 1

## 2020-07-30 MED ORDER — IBUPROFEN 800 MG PO TABS: 800.0000 mg | ORAL_TABLET | Freq: Three times a day (TID) | ORAL | 0 refills | Status: DC | PRN

## 2020-07-30 MED FILL — IBUPROFEN 800 MG TAB: 800 | 10 days supply | Qty: 30 | Fill #0

## 2020-07-30 NOTE — ED Triage Notes (Addendum)
Pt has had swelling in right hand since Saturday, states pain and swelling starting to go up arm. Denies hx of blood clots. States unable to use hand/hold anything. States has hx of this and they say "nothing is wrong"

## 2020-07-30 NOTE — Discharge Instructions (Signed)
X-ray was negative for fracture.  Suspect may be some mild overuse injury.  I recommend that you trial ibuprofen 800 mg every 8 hours for the next 5 days and then as needed.  Follow-up with your primary care doctor to see how you respond to treatment.  If the swelling gets worse and starts going up the arm or you develop any fevers or chills then please return for evaluation.

## 2020-07-30 NOTE — ED Provider Notes (Signed)
[facility] EMERGENCY DEPARTMENT Provider Note   CSN: [id number] Arrival date & time: 07/30/20  1043     History Chief Complaint  Patient presents with  . Hand Swelling    Caitlin Nichols is a 35 y.o. female.  The history is provided by the patient.  Hand Pain This is a new problem. The current episode started more than 2 days ago. The problem occurs daily. The problem has not changed since onset.Pertinent negatives include no chest pain, no abdominal pain, no headaches and no shortness of breath. Exacerbated by: movement. Nothing relieves the symptoms. She has tried nothing for the symptoms. The treatment provided no relief.       Past Medical History:  Diagnosis Date  . No pertinent past medical history     There are no problems to display for this patient.   History reviewed. No pertinent surgical history.   OB History    Gravida  2   Para  2   Term  2   Preterm  0   AB  0   Living  2     SAB  0   IAB  0   Ectopic  0   Multiple  0   Live Births  2           History reviewed. No pertinent family history.  Social History   Tobacco Use  . Smoking status: Current Every Day Smoker    Packs/day: 0.50    Types: Cigarettes  . Smokeless tobacco: Never Used  Vaping Use  . Vaping Use: Never used  Substance Use Topics  . Alcohol use: Yes    Comment: weekly  . Drug use: No    Home Medications Prior to Admission medications   Medication Sig Start Date End Date Taking? Authorizing Provider  ibuprofen (ADVIL) 800 MG tablet Take 1 tablet (800 mg total) by mouth every 8 (eight) hours as needed for up to 30 doses. 07/30/20  Yes Silas Muff, DO  amoxicillin (AMOXIL) 500 MG capsule Take 1 capsule (500 mg total) by mouth 3 (three) times daily. 12/27/18   Cristina Gong, PA-C  ibuprofen (ADVIL) 800 MG tablet Take 1 tablet (800 mg total) by mouth every 8 (eight) hours as needed for moderate pain. 03/27/20   Terrilee Files, MD   predniSONE (DELTASONE) 20 MG tablet Take 3 tablets (60 mg total) by mouth daily. 03/27/20   Terrilee Files, MD    Allergies    Patient has no known allergies.  Review of Systems   Review of Systems  Constitutional: Negative for fever.  Respiratory: Negative for shortness of breath.   Cardiovascular: Negative for chest pain and leg swelling.  Gastrointestinal: Negative for abdominal pain.  Musculoskeletal: Positive for arthralgias. Negative for back pain, gait problem, joint swelling, myalgias, neck pain and neck stiffness.  Skin: Negative for color change, pallor, rash and wound.  Neurological: Negative for weakness, numbness and headaches.    Physical Exam Updated Vital Signs BP 116/84 (BP Location: Right Arm)   Pulse 73   Temp 98.5 F (36.9 C) (Oral)   Resp 18   Ht 5\' 8"  (1.727 m)   Wt 77.1 kg   LMP 07/30/2020   SpO2 100%   BMI 25.85 kg/m   Physical Exam Constitutional:      General: She is not in acute distress.    Appearance: She is not ill-appearing.  HENT:     Head: Normocephalic and atraumatic.  Cardiovascular:  Pulses: Normal pulses.  Musculoskeletal:        General: Tenderness present. Normal range of motion.     Cervical back: Normal range of motion. No tenderness.     Comments: Some mild tenderness to the right hand digits but overall good range of motion  Skin:    General: Skin is warm.     Capillary Refill: Capillary refill takes less than 2 seconds.     Findings: No erythema.     Comments: Right hand is without any signs of infection, there is some trace swelling to the fingers of the right hand but no erythema, no swelling of the forearm or upper part of the extremity  Neurological:     General: No focal deficit present.     Mental Status: She is alert.     Sensory: No sensory deficit.     Motor: No weakness.     Comments: 5+ out of 5 strength in bilateral hands with normal sensation of bilateral hands     ED Results / Procedures /  Treatments   Labs (all labs ordered are listed, but only abnormal results are displayed) Labs Reviewed - No data to display  EKG None  Radiology DG Hand Complete Right  Result Date: 07/30/2020 CLINICAL DATA:  Hand and wrist swelling since Saturday, unable to use hand to hold anything EXAM: RIGHT HAND - COMPLETE 3+ VIEW COMPARISON:  RIGHT wrist radiographs 03/27/2020 FINDINGS: Mild scattered soft tissue swelling. Osseous mineralization normal. Joint spaces preserved. No fracture, dislocation, or bone destruction. IMPRESSION: No acute osseous abnormalities. Electronically Signed   By: Ulyses Southward M.D.   On: 07/30/2020 12:21    Procedures Procedures   Medications Ordered in ED Medications  ibuprofen (ADVIL) tablet 800 mg (800 mg Oral Given 07/30/20 1211)    ED Course  I have reviewed the triage vital signs and the nursing notes.  Pertinent labs & imaging results that were available during my care of the patient were reviewed by me and considered in my medical decision making (see chart for details).    MDM Rules/Calculators/A&P                          Caitlin Nichols is a 35 year old female who presents to the ED with right hand pain.  Patient with normal vitals.  No fever.  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  Denies any specific trauma.  X-ray showed no fracture.  Patient has good pulses of her upper extremities on exam.  No concern for arterial process.  Has normal strength and sensation bilaterally in her hands as well.  There is some mild swelling to mostly the fingers of the right hand.  No concern for infectious process as there is no erythema.  No fever.  She has no swelling beyond the fingers and overall have low suspicion for any deep space infection or blood clot.  Overall suspect some overuse injury.  Possibly some arthritic changes.  Will trial Motrin and have her follow-up with her primary care doctor.  She was told to return if she developed any  fever or worsening swelling.  This chart was dictated using voice recognition software.  Despite best efforts to proofread,  errors can occur which can change the documentation meaning.   Final Clinical Impression(s) / ED Diagnoses Final diagnoses:  Hand pain, right    Rx / DC Orders ED Discharge Orders  Ordered    ibuprofen (ADVIL) 800 MG tablet  Every 8 hours PRN        07/30/20 1231           Virgina Norfolk, DO 07/30/20 1236

## 2021-03-09 ENCOUNTER — Emergency Department (HOSPITAL_BASED_OUTPATIENT_CLINIC_OR_DEPARTMENT_OTHER)
Admission: EM | Admit: 2021-03-09 | Discharge: 2021-03-09 | Disposition: A | Discharge status: Home / Self Care | Payer: Self-pay | Attending: Emergency Medicine | Admitting: Emergency Medicine

## 2021-03-09 ENCOUNTER — Other Ambulatory Visit: Payer: Self-pay

## 2021-03-09 DIAGNOSIS — R220 Localized swelling, mass and lump, head: Secondary | ICD-10-CM | POA: Insufficient documentation

## 2021-03-09 DIAGNOSIS — Z5321 Procedure and treatment not carried out due to patient leaving prior to being seen by health care provider: Secondary | ICD-10-CM | POA: Insufficient documentation

## 2021-03-09 NOTE — ED Triage Notes (Signed)
Pt reports chipping molar tooth on the bottom left of her jaw approximately two weeks prior. Swelling began on same side last night. Pt denies pain currently. No airway compromise during triage. Breathing without distress.

## 2021-06-30 ENCOUNTER — Emergency Department (HOSPITAL_BASED_OUTPATIENT_CLINIC_OR_DEPARTMENT_OTHER): Payer: Self-pay

## 2021-06-30 ENCOUNTER — Encounter (HOSPITAL_BASED_OUTPATIENT_CLINIC_OR_DEPARTMENT_OTHER): Payer: Self-pay | Admitting: Emergency Medicine

## 2021-06-30 ENCOUNTER — Emergency Department (HOSPITAL_BASED_OUTPATIENT_CLINIC_OR_DEPARTMENT_OTHER)
Admission: EM | Admit: 2021-06-30 | Discharge: 2021-06-30 | Disposition: A | Discharge status: Home / Self Care | Payer: Self-pay | Attending: Emergency Medicine | Admitting: Emergency Medicine

## 2021-06-30 ENCOUNTER — Other Ambulatory Visit: Payer: Self-pay

## 2021-06-30 DIAGNOSIS — K219 Gastro-esophageal reflux disease without esophagitis: Secondary | ICD-10-CM | POA: Insufficient documentation

## 2021-06-30 LAB — BASIC METABOLIC PANEL
Anion gap: 7 (ref 5–15)
BUN: 12 mg/dL (ref 6–20)
CO2: 25 mmol/L (ref 22–32)
Calcium: 8.6 mg/dL — ABNORMAL LOW (ref 8.9–10.3)
Chloride: 104 mmol/L (ref 98–111)
Creatinine, Ser: 0.7 mg/dL (ref 0.44–1.00)
GFR, Estimated: >60 mL/min (ref >60)
Glucose, Bld: 80 mg/dL (ref 70–99)
Potassium: 3.5 mmol/L (ref 3.5–5.1)
Sodium: 136 mmol/L (ref 135–145)

## 2021-06-30 LAB — CBC
HCT: 36.2 % (ref 36.0–46.0)
Hemoglobin: 12.1 g/dL (ref 12.0–15.0)
MCH: 31.2 pg (ref 26.0–34.0)
MCHC: 33.4 g/dL (ref 30.0–36.0)
MCV: 93.3 fL (ref 80.0–100.0)
Platelets: 192 10*3/uL (ref 150–400)
RBC: 3.88 MIL/uL (ref 3.87–5.11)
RDW: 12.8 % (ref 11.5–15.5)
WBC: 5.5 10*3/uL (ref 4.0–10.5)
nRBC: 0 % (ref 0.0–0.2)

## 2021-06-30 LAB — TROPONIN I (HIGH SENSITIVITY): Troponin I (High Sensitivity): 2 ng/L (ref <18)

## 2021-06-30 MED ORDER — PANTOPRAZOLE SODIUM 20 MG PO TBEC: 20.0000 mg | DELAYED_RELEASE_TABLET | Freq: Every day | ORAL | 0 refills | Status: AC

## 2021-06-30 MED ORDER — PANTOPRAZOLE SODIUM 40 MG IV SOLR
40.0000 mg | Freq: Once | INTRAVENOUS | Status: AC
  Administered 2021-06-30: 40 mg via INTRAVENOUS
  Filled 2021-06-30: qty 40

## 2021-06-30 NOTE — ED Triage Notes (Signed)
Pt c/o center chest pain onset upon waking up this morning. Pt reports shortness of breath. Pt denies nausea, vomiting or diaphoresis.

## 2021-06-30 NOTE — Discharge Instructions (Addendum)
Please return to the ED with any new or worsening symptoms such as shortness of breath, excessive sweating, leg swelling, chest pains Please follow-up with your PCP in the next 3 weeks for ongoing evaluation and management of your GERD Please read the attached instructional packets on food choices to make while having GERD as well as information on the diagnosis itself Please pick up the prescription medication I prescribed you.

## 2021-06-30 NOTE — ED Provider Notes (Signed)
[facility] EMERGENCY DEPARTMENT Provider Note   CSN: [id number] Arrival date & time: 06/30/21  1330     History  Chief Complaint  Patient presents with   Chest Pain    Caitlin FramesJohnnisha Marmo is a 36 y.o. female with no reported medical history.  Patient states that this morning when she got out of bed she started to experience centralized chest pain that she states comes and goes.  The patient states that it "feels like acid reflux".  The patient states that the pain is worsened with movement and breathing.  The patient denies that the pain is worsened with exertion.  Patient denies any changes to chest pain when she leans forward and leans back.  Patient denies any radiation of pain into the jaw or left upper extremity.  Patient denies any recent illnesses, shortness of breath, lower extremity swelling, feelings of palpitations, nausea, vomiting, sweating, abdominal pain.   Chest Pain Associated symptoms: no abdominal pain, no diaphoresis, no fever, no nausea, no shortness of breath and no vomiting       Home Medications Prior to Admission medications   Medication Sig Start Date End Date Taking? Authorizing Provider  pantoprazole (PROTONIX) 20 MG tablet Take 1 tablet (20 mg total) by mouth daily. 06/30/21  Yes Al DecantGroce, Charlsie Fleeger F, PA-C  amoxicillin (AMOXIL) 500 MG capsule Take 1 capsule (500 mg total) by mouth 3 (three) times daily. 12/27/18   Cristina GongHammond, Elizabeth W, PA-C  ibuprofen (ADVIL) 800 MG tablet Take 1 tablet (800 mg total) by mouth every 8 (eight) hours as needed for moderate pain. 03/27/20   Terrilee FilesButler, Michael C, MD  ibuprofen (ADVIL) 800 MG tablet Take 1 tablet (800 mg total) by mouth every 8 (eight) hours as needed for up to 30 doses. 07/30/20   Curatolo, Adam, DO  ibuprofen (ADVIL) 800 MG tablet TAKE 1 TABLET (800 MG TOTAL) BY MOUTH EVERY 8 (EIGHT) HOURS AS NEEDED FOR UP TO 30 DOSES. 07/30/20 07/30/21  Curatolo, Adam, DO  predniSONE (DELTASONE) 20 MG tablet Take 3 tablets (60  mg total) by mouth daily. 03/27/20   Terrilee FilesButler, Michael C, MD      Allergies    Patient has no known allergies.    Review of Systems   Review of Systems  Constitutional:  Negative for diaphoresis and fever.  Respiratory:  Negative for shortness of breath.   Cardiovascular:  Positive for chest pain.  Gastrointestinal:  Negative for abdominal pain, nausea and vomiting.  All other systems reviewed and are negative.  Physical Exam Updated Vital Signs BP (!) 141/95    Pulse 71    Temp 98.1 F (36.7 C) (Oral)    Resp (!) 21    Ht 5\' 7"  (1.702 m)    Wt 74.8 kg    LMP 06/30/2021    SpO2 100%    Breastfeeding No    BMI 25.84 kg/m  Physical Exam Constitutional:      General: She is not in acute distress.    Appearance: She is well-developed. She is not ill-appearing, toxic-appearing or diaphoretic.  HENT:     Head: Normocephalic and atraumatic.     Mouth/Throat:     Mouth: Mucous membranes are moist.  Eyes:     Extraocular Movements: Extraocular movements intact.     Pupils: Pupils are equal, round, and reactive to light.  Neck:     Vascular: No JVD.     Trachea: No tracheal deviation.  Cardiovascular:     Rate and Rhythm: Normal  rate and regular rhythm.     Heart sounds: Normal heart sounds.    No friction rub.  Pulmonary:     Effort: Pulmonary effort is normal. No accessory muscle usage.     Breath sounds: Normal breath sounds. No wheezing, rhonchi or rales.  Chest:     Chest wall: No mass, deformity or tenderness.  Abdominal:     General: Bowel sounds are normal.     Palpations: Abdomen is soft. There is no mass.     Tenderness: There is no abdominal tenderness. There is no rebound.  Musculoskeletal:        General: Normal range of motion.     Cervical back: Normal range of motion and neck supple. No rigidity or tenderness.     Right lower leg: No edema.     Left lower leg: No edema.  Skin:    General: Skin is warm and dry.     Capillary Refill: Capillary refill takes less  than 2 seconds.  Neurological:     General: No focal deficit present.     Mental Status: She is alert and oriented to person, place, and time.    ED Results / Procedures / Treatments   Labs (all labs ordered are listed, but only abnormal results are displayed) Labs Reviewed  BASIC METABOLIC PANEL - Abnormal; Notable for the following components:      Result Value   Calcium 8.6 (*)    All other components within normal limits  CBC  TROPONIN I (HIGH SENSITIVITY)    EKG None  Radiology DG Chest 2 View  Result Date: 06/30/2021 CLINICAL DATA:  Left-sided chest pain.  Shortness of breath. EXAM: CHEST - 2 VIEW COMPARISON:  09/10/2015 FINDINGS: The lungs are clear without focal pneumonia, edema, pneumothorax or pleural effusion. The cardiopericardial silhouette is within normal limits for size. The visualized bony structures of the thorax show no acute abnormality. Telemetry leads overlie the chest. IMPRESSION: No active cardiopulmonary disease. Electronically Signed   By: Kennith Center M.D.   On: 06/30/2021 14:18    Procedures Procedures   Medications Ordered in ED Medications  pantoprazole (PROTONIX) injection 40 mg (40 mg Intravenous Given 06/30/21 1535)    ED Course/ Medical Decision Making/ A&P                           Medical Decision Making Amount and/or Complexity of Data Reviewed Labs: ordered. Radiology: ordered.  Risk Prescription drug management.   36 year old female who comes in complaining of centralized chest pain that she feels is "acid reflux".  On examination, the patient is nonhypoxic, nontachycardic, hypertensive at 141/95, no lower extremity swelling, no diaphoresis, nontoxic in appearance, there is no JVD.  Patient denies chest pain worsening or lessening with positional changes.  Patient lab work ordered and interpreted by me includes: Troponin, BMP, CBC, chest x-ray, EKG.  Troponin results at 2, within normal limits CBC within normal limits BMP  shows slightly decreased calcium at 8.6 Patient chest x-ray interpreted by me shows no signs of cardiopulmonary disease. Patient EKG shows normal sinus rhythm Patient heart score 1  Patient was treated utilizing 40 mg of IV Protonix.  Patient states after administration of this medication she felt much better.  Patient stated that "I really like that, what ever it is.  I have no pain now".  Due to this, I have a strong suspicion that the pain the patient was experiencing was related to  esophageal reflux.  I will prescribe this patient a prescription for Protonix and advised her to follow-up with her PCP in the next 7 days for ongoing management of this issue.  I have provided this patient with return precautions and she voiced understanding.  All the patient's questions were answered to her satisfaction.       At this time, the patient seems stable for discharge.  Patient's lab work was reviewed by Dr. Wallace Cullens who is in agreement with the plan.  The patient stable at time of discharge         Final Clinical Impression(s) / ED Diagnoses Final diagnoses:  Gastroesophageal reflux disease, unspecified whether esophagitis present    Rx / DC Orders ED Discharge Orders          Ordered    pantoprazole (PROTONIX) 20 MG tablet  Daily        06/30/21 1627              Al Decant, PA-C 06/30/21 1628    Edwin Dada P, DO 07/02/21 1120

## 2021-09-25 ENCOUNTER — Encounter (HOSPITAL_BASED_OUTPATIENT_CLINIC_OR_DEPARTMENT_OTHER): Payer: Self-pay

## 2021-09-25 ENCOUNTER — Emergency Department (HOSPITAL_BASED_OUTPATIENT_CLINIC_OR_DEPARTMENT_OTHER): Payer: Self-pay

## 2021-09-25 ENCOUNTER — Emergency Department (HOSPITAL_BASED_OUTPATIENT_CLINIC_OR_DEPARTMENT_OTHER)
Admission: EM | Admit: 2021-09-25 | Discharge: 2021-09-25 | Disposition: A | Discharge status: Home / Self Care | Payer: Self-pay | Attending: Emergency Medicine | Admitting: Emergency Medicine

## 2021-09-25 ENCOUNTER — Other Ambulatory Visit: Payer: Self-pay

## 2021-09-25 DIAGNOSIS — J069 Acute upper respiratory infection, unspecified: Secondary | ICD-10-CM | POA: Insufficient documentation

## 2021-09-25 DIAGNOSIS — Z8616 Personal history of COVID-19: Secondary | ICD-10-CM | POA: Insufficient documentation

## 2021-09-25 DIAGNOSIS — F17200 Nicotine dependence, unspecified, uncomplicated: Secondary | ICD-10-CM | POA: Insufficient documentation

## 2021-09-25 DIAGNOSIS — R509 Fever, unspecified: Secondary | ICD-10-CM

## 2021-09-25 DIAGNOSIS — Z20822 Contact with and (suspected) exposure to covid-19: Secondary | ICD-10-CM | POA: Insufficient documentation

## 2021-09-25 LAB — RESP PANEL BY RT-PCR (FLU A&B, COVID) ARPGX2
Influenza A by PCR: NEGATIVE
Influenza B by PCR: NEGATIVE
SARS Coronavirus 2 by RT PCR: NEGATIVE

## 2021-09-25 NOTE — Discharge Instructions (Signed)
Please read and follow all provided instructions. ? ?Your diagnoses today include:  ?1. Upper respiratory tract infection, unspecified type   ?2. Febrile illness   ? ? ?You appear to have an upper respiratory infection (URI). An upper respiratory tract infection, or cold, is a viral infection of the air passages leading to the lungs. It should improve gradually after 5-7 days. You may have a lingering cough that lasts for 2- 4 weeks after the infection. ? ?Tests performed today include: ?Vital signs. See below for your results today.  ?COVID and flu testing: Were negative ?Chest x-ray: No sign of pneumonia ? ?Medications prescribed:  ?None ? ?Take any prescribed medications only as directed. Treatment for your infection is aimed at treating the symptoms. There are no medications, such as antibiotics, that will cure your infection.  ? ?Home care instructions:  ?You can take Tylenol and/or Ibuprofen as directed on the packaging for fever reduction and pain relief.  ?  ?For cough: honey 1/2 to 1 teaspoon (you can dilute the honey in water or another fluid).  You can also use guaifenesin and dextromethorphan for cough. You can use a humidifier for chest congestion and cough.  If you don't have a humidifier, you can sit in the bathroom with the hot shower running.    ?  ?For sore throat: try warm salt water gargles, cepacol lozenges, throat spray, warm tea or water with lemon/honey, popsicles or ice, or OTC cold relief medicine for throat discomfort.  ?  ?For congestion: take a daily anti-histamine like Zyrtec, Claritin, and a oral decongestant, such as pseudoephedrine.  You can also use Flonase 1-2 sprays in each nostril daily.  ?  ?It is important to stay hydrated: drink plenty of fluids (water, gatorade/powerade/pedialyte, juices, or teas) to keep your throat moisturized and help further relieve irritation/discomfort.  ? ?Your illness is contagious and can be spread to others, especially during the first 3 or 4 days.  It cannot be cured by antibiotics or other medicines. Take basic precautions such as washing your hands often, covering your mouth when you cough or sneeze, and avoiding public places where you could spread your illness to others.  ? ?Please continue drinking plenty of fluids.  Use over-the-counter medicines as needed as directed on packaging for symptom relief.  You may also use ibuprofen or tylenol as directed on packaging for pain or fever.  Do not take multiple medicines containing Tylenol or acetaminophen to avoid taking too much of this medication. ? ?Follow-up instructions: ?Please follow-up with your primary care provider in the next 3 days for further evaluation of your symptoms if you are not feeling better.  ? ?Return instructions:  ?Please return to the Emergency Department if you experience worsening symptoms.  ?RETURN IMMEDIATELY IF you develop shortness of breath, confusion or altered mental status, a new rash, become dizzy, faint, or poorly responsive, or are unable to be cared for at home. ?Please return if you have persistent vomiting and cannot keep down fluids or develop a fever that is not controlled by tylenol or motrin.   ?Please return if you have any other emergent concerns. ? ?Additional Information: ? ?Your vital signs today were: ?BP 119/90   Pulse 92   Temp 98.4 ?F (36.9 ?C) (Oral)   Resp 18   Ht 5\' 7"  (1.702 m)   Wt 77.1 kg   LMP 09/11/2021 (Approximate)   SpO2 99%   BMI 26.63 kg/m?  ?If your blood pressure (BP) was elevated above  135/85 this visit, please have this repeated by your doctor within one month. ?-------------- ? ?

## 2021-09-25 NOTE — ED Provider Notes (Signed)
?[facility] EMERGENCY DEPARTMENT ?Provider Note ? ? ?CSN: [id number] ?Arrival date & time: 09/25/21  1005 ? ?  ? ?History ? ?Chief Complaint  ?Patient presents with  ? Fever  ? ? ?Caitlin Nichols is a 36 y.o. female. ? ?Patient with no significant past medical history, COVID infection 4 months ago, current smoker --presents to the emergency department on day 3 of current illness that includes cough, fever, body aches.  Patient states that she started with a headache and woke up the next morning with body aches.  She had nausea but no vomiting.  No urinary symptoms or skin changes.  No ear pain or sore throat.  Cough is nonproductive.  No known sick contacts.  She does report stress with work.  She has been taking Tylenol at home.  ? ? ?  ? ?Home Medications ?Prior to Admission medications   ?Medication Sig Start Date End Date Taking? Authorizing Provider  ?amoxicillin (AMOXIL) 500 MG capsule Take 1 capsule (500 mg total) by mouth 3 (three) times daily. 12/27/18   Cristina Gong, PA-C  ?ibuprofen (ADVIL) 800 MG tablet Take 1 tablet (800 mg total) by mouth every 8 (eight) hours as needed for moderate pain. 03/27/20   Terrilee Files, MD  ?ibuprofen (ADVIL) 800 MG tablet Take 1 tablet (800 mg total) by mouth every 8 (eight) hours as needed for up to 30 doses. 07/30/20   Curatolo, Adam, DO  ?pantoprazole (PROTONIX) 20 MG tablet Take 1 tablet (20 mg total) by mouth daily. 06/30/21   Al Decant, PA-C  ?predniSONE (DELTASONE) 20 MG tablet Take 3 tablets (60 mg total) by mouth daily. 03/27/20   Terrilee Files, MD  ?   ? ?Allergies    ?Patient has no known allergies.   ? ?Review of Systems   ?Review of Systems ? ?Physical Exam ?Updated Vital Signs ?BP (!) 138/106 (BP Location: Right Arm)   Pulse (!) 102   Temp 98.4 ?F (36.9 ?C) (Oral)   Resp 20   Ht 5\' 7"  (1.702 m)   Wt 77.1 kg   LMP 09/11/2021 (Approximate)   SpO2 99%   BMI 26.63 kg/m?  ? ?Physical Exam ?Vitals and nursing note reviewed.   ?Constitutional:   ?   Appearance: She is well-developed.  ?HENT:  ?   Head: Normocephalic and atraumatic.  ?   Jaw: No trismus.  ?   Right Ear: Tympanic membrane, ear canal and external ear normal.  ?   Left Ear: Tympanic membrane, ear canal and external ear normal.  ?   Nose: Nose normal. No mucosal edema or rhinorrhea.  ?   Mouth/Throat:  ?   Mouth: Mucous membranes are moist. Mucous membranes are not dry. No oral lesions.  ?   Pharynx: Uvula midline. No oropharyngeal exudate, posterior oropharyngeal erythema or uvula swelling.  ?   Tonsils: No tonsillar abscesses.  ?Eyes:  ?   General:     ?   Right eye: No discharge.     ?   Left eye: No discharge.  ?   Conjunctiva/sclera: Conjunctivae normal.  ?Cardiovascular:  ?   Rate and Rhythm: Normal rate and regular rhythm.  ?   Heart sounds: Normal heart sounds.  ?Pulmonary:  ?   Effort: Pulmonary effort is normal. No respiratory distress.  ?   Breath sounds: Normal breath sounds. No wheezing or rales.  ?Abdominal:  ?   Palpations: Abdomen is soft.  ?   Tenderness: There is no  abdominal tenderness.  ?Musculoskeletal:  ?   Cervical back: Normal range of motion and neck supple.  ?Lymphadenopathy:  ?   Cervical: No cervical adenopathy.  ?Skin: ?   General: Skin is warm and dry.  ?Neurological:  ?   Mental Status: She is alert.  ?Psychiatric:     ?   Mood and Affect: Mood normal.  ? ? ?ED Results / Procedures / Treatments   ?Labs ?(all labs ordered are listed, but only abnormal results are displayed) ?Labs Reviewed  ?RESP PANEL BY RT-PCR (FLU A&B, COVID) [id number]  ? ? ?EKG ?None ? ?Radiology ?DG Chest 2 View ? ?Result Date: 09/25/2021 ?CLINICAL DATA:  Cough, fever. EXAM: CHEST - 2 VIEW COMPARISON:  June 30, 2021. FINDINGS: The heart size and mediastinal contours are within normal limits. Both lungs are clear. The visualized skeletal structures are unremarkable. IMPRESSION: No active cardiopulmonary disease. Electronically Signed   By: Lupita Raider M.D.   On: 09/25/2021  11:02   ? ?Procedures ?Procedures  ? ? ?Medications Ordered in ED ?Medications - No data to display ? ?ED Course/ Medical Decision Making/ A&P ?  ? ?Patient seen and examined. History obtained directly from patient.  ? ?Labs/EKG: Ordered flu/COVID testing ? ?Imaging: Ordered chest x-ray ? ?Medications/Fluids: None ordered ? ?Most recent vital signs reviewed and are as follows: ?BP (!) 138/106 (BP Location: Right Arm)   Pulse (!) 102   Temp 98.4 ?F (36.9 ?C) (Oral)   Resp 20   Ht 5\' 7"  (1.702 m)   Wt 77.1 kg   LMP 09/11/2021 (Approximate)   SpO2 99%   BMI 26.63 kg/m?  ? ?Initial impression: Flulike illness, rule out pneumonia. ? ?12:21 PM Reassessment performed. Patient appears comfortable. ? ?Labs personally reviewed and interpreted including: Negative flu and COVID testing. ? ?Imaging personally visualized and interpreted including: Chest x-ray, agree negative. ? ?Reviewed pertinent lab work and imaging with patient at bedside. Questions answered.  ? ?Most current vital signs reviewed and are as follows: ?BP 119/90   Pulse 92   Temp 98.4 ?F (36.9 ?C) (Oral)   Resp 18   Ht 5\' 7"  (1.702 m)   Wt 77.1 kg   LMP 09/11/2021 (Approximate)   SpO2 99%   BMI 26.63 kg/m?  ? ?Plan: Discharge to home.  ? ?Prescriptions written for: None ? ?Other home care instructions discussed: OTS meds, adequate rest and hydration ? ?ED return instructions discussed: Discussed s/s to return including worsening symptoms, persistent fever, persistent vomiting, or if they have any other concerns. ? ?Follow-up instructions discussed: Patient encouraged to follow-up with their PCP in 3 days.  ? ? ?                        ?Medical Decision Making ?Amount and/or Complexity of Data Reviewed ?Radiology: ordered. ? ? ?Patient with symptoms consistent with a viral syndrome.  She does have a fever, intermittent, and body aches.  Flu and COVID negative.  Chest x-ray negative for pneumonia.  Vitals are stable, no fever. No signs of  dehydration. Supportive therapy indicated with return if symptoms worsen.   ? ? ? ? ? ? ? ? ?Final Clinical Impression(s) / ED Diagnoses ?Final diagnoses:  ?Upper respiratory tract infection, unspecified type  ?Febrile illness  ? ? ?Rx / DC Orders ?ED Discharge Orders   ? ? None  ? ?  ? ? ?  ? , PA-C ?09/25/21 1223 ? ?  ?Long, Renne Crigler,  MD ?09/26/21 [id number] ? ?

## 2021-09-25 NOTE — ED Triage Notes (Signed)
C/o cough, fever, chills, bodyaches, nausea x 2 days. Slow steady gait to triage. Tolerating fluids. ?

## 2022-10-28 ENCOUNTER — Emergency Department (HOSPITAL_BASED_OUTPATIENT_CLINIC_OR_DEPARTMENT_OTHER)
Admission: EM | Admit: 2022-10-28 | Discharge: 2022-10-28 | Disposition: A | Discharge status: Home / Self Care | Payer: Commercial Managed Care - PPO | Attending: Emergency Medicine | Admitting: Emergency Medicine

## 2022-10-28 ENCOUNTER — Other Ambulatory Visit: Payer: Self-pay

## 2022-10-28 ENCOUNTER — Emergency Department (HOSPITAL_BASED_OUTPATIENT_CLINIC_OR_DEPARTMENT_OTHER): Payer: Commercial Managed Care - PPO

## 2022-10-28 ENCOUNTER — Encounter (HOSPITAL_BASED_OUTPATIENT_CLINIC_OR_DEPARTMENT_OTHER): Payer: Self-pay

## 2022-10-28 ENCOUNTER — Other Ambulatory Visit (HOSPITAL_BASED_OUTPATIENT_CLINIC_OR_DEPARTMENT_OTHER): Payer: Self-pay

## 2022-10-28 DIAGNOSIS — K5900 Constipation, unspecified: Secondary | ICD-10-CM | POA: Diagnosis not present

## 2022-10-28 DIAGNOSIS — R1032 Left lower quadrant pain: Secondary | ICD-10-CM | POA: Diagnosis present

## 2022-10-28 DIAGNOSIS — J90 Pleural effusion, not elsewhere classified: Secondary | ICD-10-CM | POA: Diagnosis not present

## 2022-10-28 DIAGNOSIS — I3139 Other pericardial effusion (noninflammatory): Secondary | ICD-10-CM | POA: Insufficient documentation

## 2022-10-28 DIAGNOSIS — N3000 Acute cystitis without hematuria: Secondary | ICD-10-CM | POA: Diagnosis not present

## 2022-10-28 LAB — CBC WITH DIFFERENTIAL/PLATELET
Abs Immature Granulocytes: 0.05 10*3/uL (ref 0.00–0.07)
Basophils Absolute: 0 10*3/uL (ref 0.0–0.1)
Basophils Relative: 0 %
Eosinophils Absolute: 0 10*3/uL (ref 0.0–0.5)
Eosinophils Relative: 0 %
HCT: 35.2 % — ABNORMAL LOW (ref 36.0–46.0)
Hemoglobin: 11.7 g/dL — ABNORMAL LOW (ref 12.0–15.0)
Immature Granulocytes: 0 %
Lymphocytes Relative: 12 %
Lymphs Abs: 1.4 10*3/uL (ref 0.7–4.0)
MCH: 30.5 pg (ref 26.0–34.0)
MCHC: 33.2 g/dL (ref 30.0–36.0)
MCV: 91.9 fL (ref 80.0–100.0)
Monocytes Absolute: 0.7 10*3/uL (ref 0.1–1.0)
Monocytes Relative: 6 %
Neutro Abs: 9.8 10*3/uL — ABNORMAL HIGH (ref 1.7–7.7)
Neutrophils Relative %: 82 %
Platelets: 265 10*3/uL (ref 150–400)
RBC: 3.83 MIL/uL — ABNORMAL LOW (ref 3.87–5.11)
RDW: 13.1 % (ref 11.5–15.5)
WBC: 12 10*3/uL — ABNORMAL HIGH (ref 4.0–10.5)
nRBC: 0 % (ref 0.0–0.2)

## 2022-10-28 LAB — URINALYSIS, ROUTINE W REFLEX MICROSCOPIC
Bilirubin Urine: NEGATIVE
Glucose, UA: NEGATIVE mg/dL
Ketones, ur: 15 mg/dL — AB
Nitrite: NEGATIVE
Protein, ur: NEGATIVE mg/dL
Specific Gravity, Urine: 1.015 (ref 1.005–1.030)
pH: 7 (ref 5.0–8.0)

## 2022-10-28 LAB — COMPREHENSIVE METABOLIC PANEL
ALT: 9 U/L (ref 0–44)
AST: 15 U/L (ref 15–41)
Albumin: 3.4 g/dL — ABNORMAL LOW (ref 3.5–5.0)
Alkaline Phosphatase: 59 U/L (ref 38–126)
Anion gap: 11 (ref 5–15)
BUN: 7 mg/dL (ref 6–20)
CO2: 25 mmol/L (ref 22–32)
Calcium: 8.3 mg/dL — ABNORMAL LOW (ref 8.9–10.3)
Chloride: 97 mmol/L — ABNORMAL LOW (ref 98–111)
Creatinine, Ser: 0.69 mg/dL (ref 0.44–1.00)
GFR, Estimated: >60 mL/min (ref >60)
Glucose, Bld: 96 mg/dL (ref 70–99)
Potassium: 3 mmol/L — ABNORMAL LOW (ref 3.5–5.1)
Sodium: 133 mmol/L — ABNORMAL LOW (ref 135–145)
Total Bilirubin: 1 mg/dL (ref 0.3–1.2)
Total Protein: 8.7 g/dL — ABNORMAL HIGH (ref 6.5–8.1)

## 2022-10-28 LAB — BRAIN NATRIURETIC PEPTIDE: B Natriuretic Peptide: 103.5 pg/mL — ABNORMAL HIGH (ref 0.0–100.0)

## 2022-10-28 LAB — URINALYSIS, MICROSCOPIC (REFLEX)

## 2022-10-28 LAB — TSH: TSH: 3.118 u[IU]/mL (ref 0.350–4.500)

## 2022-10-28 LAB — SEDIMENTATION RATE: Sed Rate: 109 mm/hr — ABNORMAL HIGH (ref 0–22)

## 2022-10-28 LAB — TROPONIN I (HIGH SENSITIVITY): Troponin I (High Sensitivity): 15 ng/L (ref <18)

## 2022-10-28 LAB — C-REACTIVE PROTEIN: CRP: 20 mg/dL — ABNORMAL HIGH (ref <1.0)

## 2022-10-28 LAB — LIPASE, BLOOD: Lipase: 24 U/L (ref 11–51)

## 2022-10-28 LAB — LACTIC ACID, PLASMA: Lactic Acid, Venous: 1 mmol/L (ref 0.5–1.9)

## 2022-10-28 LAB — PREGNANCY, URINE: Preg Test, Ur: NEGATIVE

## 2022-10-28 MED ORDER — CEPHALEXIN 500 MG PO CAPS
500.0000 mg | ORAL_CAPSULE | Freq: Four times a day (QID) | ORAL | 0 refills | Status: DC
  Filled 2022-10-28: qty 28, 7d supply, fill #0

## 2022-10-28 MED ORDER — POTASSIUM CHLORIDE CRYS ER 20 MEQ PO TBCR
40.0000 meq | EXTENDED_RELEASE_TABLET | Freq: Once | ORAL | Status: AC
  Administered 2022-10-28: 40 meq via ORAL
  Filled 2022-10-28: qty 2

## 2022-10-28 MED ORDER — IOHEXOL 350 MG/ML SOLN
100.0000 mL | Freq: Once | INTRAVENOUS | Status: AC | PRN
  Administered 2022-10-28: 100 mL via INTRAVENOUS

## 2022-10-28 MED ORDER — ACETAMINOPHEN 500 MG PO TABS
1000.0000 mg | ORAL_TABLET | Freq: Once | ORAL | Status: AC
  Administered 2022-10-28: 1000 mg via ORAL
  Filled 2022-10-28: qty 2

## 2022-10-28 MED ORDER — CEPHALEXIN 250 MG PO CAPS
500.0000 mg | ORAL_CAPSULE | Freq: Once | ORAL | Status: AC
  Administered 2022-10-28: 500 mg via ORAL
  Filled 2022-10-28: qty 2

## 2022-10-28 MED ORDER — ONDANSETRON 4 MG PO TBDP
8.0000 mg | ORAL_TABLET | Freq: Once | ORAL | Status: AC
  Administered 2022-10-28: 8 mg via ORAL
  Filled 2022-10-28: qty 2

## 2022-10-28 MED ORDER — PHENAZOPYRIDINE HCL 100 MG PO TABS
200.0000 mg | ORAL_TABLET | Freq: Once | ORAL | Status: AC
  Administered 2022-10-28: 200 mg via ORAL
  Filled 2022-10-28: qty 2

## 2022-10-28 MED ORDER — PHENAZOPYRIDINE HCL 200 MG PO TABS
200.0000 mg | ORAL_TABLET | Freq: Three times a day (TID) | ORAL | 0 refills | Status: DC
  Filled 2022-10-28: qty 6, 2d supply, fill #0

## 2022-10-28 NOTE — ED Provider Notes (Signed)
Signout from Dr. Daun Peacock.  37 year old female with no significant past medical history here with left flank left lower quadrant abdominal pain going on for 3 days.  No known trauma.  No urinary symptoms.  Workup so far showing negative pregnancy test and urinalysis without definite signs of infection.  CT renal showing pericardial effusion small pleural effusions, small ascites.  She is getting labs EKG for further workup. Physical Exam  BP (!) 142/131 (BP Location: Right Arm)   Pulse (!) 117   Temp 99.7 F (37.6 C) (Oral)   Resp 20   Ht 5\' 8"  (1.727 m)   Wt 70.8 kg   LMP 10/12/2022 (Exact Date)   SpO2 100%   BMI 23.72 kg/m   Physical Exam  Procedures  Procedures  ED Course / MDM    Medical Decision Making Amount and/or Complexity of Data Reviewed Labs: ordered. Radiology: ordered.  Risk OTC drugs. Prescription drug management.   8:12 AM.  Discussed with [facility] cardiology on-call Dr. Jacques Navy  9:20.  CT angio chest abdomen pelvis does not show any other further findings of edema pleural effusion pericardial effusion.  Cardiology has given her follow-up appointment and echo in 2 days.  Patient was instructed to keep those appointments.  Return instructions discussed.       Terrilee Files, MD 10/29/22 [phone]

## 2022-10-28 NOTE — ED Provider Notes (Signed)
[facility] Provider Note   CSN: [id number] Arrival date & time: 10/28/22  0550     History  Chief Complaint  Patient presents with   Abdominal Pain    Caitlin Nichols is a 37 y.o. female.  The history is provided by the patient.  Abdominal Pain Pain location:  L flank Pain quality: aching   Pain radiates to:  LLQ Pain severity:  Severe Onset quality:  Gradual Duration:  3 days Timing:  Constant Progression:  Unchanged Chronicity:  New Context: not recent travel, not sick contacts and not trauma   Context comment:  Lifts for work Relieved by:  Nothing Worsened by:  Nothing Ineffective treatments:  None tried Associated symptoms: no anorexia, no diarrhea, no dysuria, no fever, no hematuria and no vomiting   Patient without PMH presents with Left flank pain radiating to suprapubic area. No fevers, no vomiting.       Past Medical History:  Diagnosis Date   No pertinent past medical history      Home Medications Prior to Admission medications   Medication Sig Start Date End Date Taking? Authorizing Provider  amoxicillin (AMOXIL) 500 MG capsule Take 1 capsule (500 mg total) by mouth 3 (three) times daily. 12/27/18   Cristina Gong, PA-C  ibuprofen (ADVIL) 800 MG tablet Take 1 tablet (800 mg total) by mouth every 8 (eight) hours as needed for moderate pain. 03/27/20   Terrilee Files, MD  ibuprofen (ADVIL) 800 MG tablet Take 1 tablet (800 mg total) by mouth every 8 (eight) hours as needed for up to 30 doses. 07/30/20   Curatolo, Adam, DO  pantoprazole (PROTONIX) 20 MG tablet Take 1 tablet (20 mg total) by mouth daily. 06/30/21   Al Decant, PA-C  predniSONE (DELTASONE) 20 MG tablet Take 3 tablets (60 mg total) by mouth daily. 03/27/20   Terrilee Files, MD      Allergies    Patient has no known allergies.    Review of Systems   Review of Systems  Constitutional:  Negative for fever.  HENT:  Negative for  facial swelling.   Eyes:  Negative for redness.  Respiratory:  Negative for wheezing and stridor.   Gastrointestinal:  Negative for anorexia, diarrhea and vomiting.  Genitourinary:  Positive for flank pain. Negative for dysuria, hematuria and urgency.  Neurological:  Negative for numbness.  Psychiatric/Behavioral:  Negative for agitation.   All other systems reviewed and are negative.   Physical Exam Updated Vital Signs Ht 5\' 8"  (1.727 m)   Wt 70.8 kg   LMP 10/12/2022 (Exact Date)   BMI 23.72 kg/m  Physical Exam Vitals and nursing note reviewed.  Constitutional:      General: She is not in acute distress.    Appearance: Normal appearance. She is well-developed.  HENT:     Head: Normocephalic and atraumatic.     Nose: Nose normal.  Eyes:     Pupils: Pupils are equal, round, and reactive to light.  Cardiovascular:     Rate and Rhythm: Normal rate and regular rhythm.     Pulses: Normal pulses.     Heart sounds: Normal heart sounds.  Pulmonary:     Effort: Pulmonary effort is normal. No respiratory distress.     Breath sounds: Normal breath sounds.  Abdominal:     General: Bowel sounds are normal.     Palpations: Abdomen is soft.     Tenderness: There is no guarding or  rebound.     Hernia: No hernia is present.  Genitourinary:    Vagina: No vaginal discharge.  Musculoskeletal:        General: Normal range of motion.     Cervical back: Normal range of motion and neck supple.  Skin:    General: Skin is warm and dry.     Capillary Refill: Capillary refill takes less than 2 seconds.     Findings: No erythema or rash.  Neurological:     General: No focal deficit present.     Mental Status: She is alert and oriented to person, place, and time.     Deep Tendon Reflexes: Reflexes normal.  Psychiatric:        Mood and Affect: Mood normal.     ED Results / Procedures / Treatments   Labs (all labs ordered are listed, but only abnormal results are displayed) Labs Reviewed   URINALYSIS, ROUTINE W REFLEX MICROSCOPIC  PREGNANCY, URINE    EKG None  Radiology No results found.  Procedures Procedures    Medications Ordered in ED Medications - No data to display  ED Course/ Medical Decision Making/ A&P                             Medical Decision Making Patient with L flank pain radiating to the suprapubic area   Amount and/or Complexity of Data Reviewed External Data Reviewed: notes.    Details: Previous notes reviewed  Labs: ordered. Radiology: ordered and independent interpretation performed.    Details: Constipation by me on CT, with pleural effusions   Risk OTC drugs. Prescription drug management. Risk Details: Well appearing.  No fevers, no vomiting.      Final Clinical Impression(s) / ED Diagnoses Final diagnoses:  None   Signed out to Dr. Charm Barges pending labs  Rx / DC Orders ED Discharge Orders     None         Jacori Mulrooney, MD 10/28/22 0700

## 2022-10-28 NOTE — Discharge Instructions (Addendum)
Start miralax one capful every morning in a large glass of water.  Antibiotics for possible urine infection.  You have an appointment for a echocardiogram of your heart and a visit with a cardiologist on Thursday.  Please keep these appointments.  Return to the emergency department if any worsening or concerning symptoms.

## 2022-10-28 NOTE — ED Triage Notes (Signed)
Pt reports that on the weekend she began to have intermittent chills and is now c/o "kidney pain". Reports she feels like she is hurting internally. Pt states she drinks 4 Dr. Alcus Dad a day and 2 cups of coffee and little water.

## 2022-10-29 NOTE — Progress Notes (Unsigned)
Cardiology Office Note   Date:  10/30/2022   ID:  Caitlin Nichols, DOB 02/25/1986, MRN [id number]  PCP:  Patient, No Pcp Per  Cardiologist:   None Referring:  ED  Chief Complaint  Patient presents with   Chest Pain      History of Present Illness: Caitlin Nichols is a 37 y.o. female who presents for evaluation of chest pain.  She was in the emergency room yesterday and I was able to review these records.  She had a CT of her chest which demonstrated a moderate pericardial effusion.  There were small bilateral pleural effusions.  This was apparently done to rule out dissection or embolism and there was no evidence of aortic dissection or pulmonary embolism.  An echocardiogram was done this morning demonstrated preserved ejection fraction with a small pericardial effusion.  I see from the ER records that she did have a slightly elevated ESR and CRP.  She was noted in the emergency room to have a urinary infection and is being treated with antibiotics.  CT renal study demonstrated the pericardial effusion with trace ascites and bilateral small pleural effusion.  She presented 2 days ago in the emergency room but her symptoms started about 3 to 4 days prior to that.  She describes a sharp discomfort worse lying down.  She has had a cough.  She is felt feverish with some chills.  She has been short of breath.  She has never had this kind of pain before.  Despite the diagnosis of urinary infection and possible cystitis she has not really had any urinary complaints other than possible hematuria.  However, she is on her menstrual period.  She is otherwise not had any prior cardiac history.  She is usually active.  She works.  Of note her EKG demonstrated some borderline low voltage but no ST changes.  There was 1 troponin drawn which was normal.  She did have a slight leukocytosis.  She had a mildly low potassium supplemented.   Past Medical History:  Diagnosis Date   No pertinent past medical  history     History reviewed. No pertinent surgical history.   Current Outpatient Medications  Medication Sig Dispense Refill   colchicine 0.6 MG tablet Take 1 tablet (0.6 mg total) by mouth 2 (two) times daily. 180 tablet 0   ibuprofen (ADVIL) 600 MG tablet Take 1 tablet (600 mg total) by mouth every 8 (eight) hours as needed. 30 tablet 0   amoxicillin (AMOXIL) 500 MG capsule Take 1 capsule (500 mg total) by mouth 3 (three) times daily. 21 capsule 0   cephALEXin (KEFLEX) 500 MG capsule Take 1 capsule (500 mg total) by mouth 4 (four) times daily. 28 capsule 0   pantoprazole (PROTONIX) 20 MG tablet Take 1 tablet (20 mg total) by mouth daily. 21 tablet 0   phenazopyridine (PYRIDIUM) 200 MG tablet Take 1 tablet (200 mg total) by mouth 3 (three) times daily. 6 tablet 0   predniSONE (DELTASONE) 20 MG tablet Take 3 tablets (60 mg total) by mouth daily. 12 tablet 0   No current facility-administered medications for this visit.    Allergies:   Patient has no known allergies.    Social History:  The patient  reports that she has been smoking cigarettes. She has been smoking an average of .5 packs per day. She has never used smokeless tobacco. She reports current alcohol use. She reports that she does not currently use drugs after having used the  following drugs: Marijuana.   Family History:  The patient's family history is not on file.    ROS:  Please see the history of present illness.   Otherwise, review of systems are positive for none.   All other systems are reviewed and negative.    PHYSICAL EXAM: VS:  BP 102/68 (BP Location: Left Arm, Patient Position: Sitting, Cuff Size: Normal)   Pulse (!) 114   Ht 5\' 8"  (1.727 m)   Wt 147 lb 12.8 oz (67 kg)   LMP 10/12/2022 (Exact Date)   SpO2 96%   BMI 22.47 kg/m  , BMI Body mass index is 22.47 kg/m. GENERAL:  Well appearing HEENT:  Pupils equal round and reactive, fundi not visualized, oral mucosa unremarkable NECK:  No jugular venous  distention, waveform within normal limits, carotid upstroke brisk and symmetric, no bruits, no thyromegaly LYMPHATICS:  No cervical, inguinal adenopathy LUNGS:  Clear to auscultation bilaterally BACK:  No CVA tenderness CHEST:  Unremarkable HEART:  PMI not displaced or sustained,S1 and S2 within normal limits, no S3, no S4, no clicks, no rubs, no murmurs ABD:  Flat, positive bowel sounds normal in frequency in pitch, no bruits, no rebound, no guarding, no midline pulsatile mass, no hepatomegaly, no splenomegaly EXT:  2 plus pulses throughout, no edema, no cyanosis no clubbing SKIN:  No rashes no nodules NEURO:  Cranial nerves II through XII grossly intact, motor grossly intact throughout PSYCH:  Cognitively intact, oriented to person place and time    EKG:  EKG is ordered today. The ekg ordered today demonstrates regular rhythm, rate 100, low voltage in the limb and chest leads, no acute ST-T wave changes, voltage is not significantly different than previous.   Recent Labs: 10/28/2022: ALT 9; B Natriuretic Peptide 103.5; BUN 7; Creatinine, Ser 0.69; Hemoglobin 11.7; Platelets 265; Potassium 3.0; Sodium 133; TSH 3.118    Lipid Panel No results found for: "CHOL", "TRIG", "HDL", "CHOLHDL", "VLDL", "LDLCALC", "LDLDIRECT"    Wt Readings from Last 3 Encounters:  10/30/22 147 lb 12.8 oz (67 kg)  10/28/22 156 lb (70.8 kg)  09/25/21 170 lb (77.1 kg)      Other studies Reviewed: Additional studies/ records that were reviewed today include: Labs, ED records. Review of the above records demonstrates:  Please see elsewhere in the note.     ASSESSMENT AND PLAN:  Pericardial effusion: I suspect she has pericarditis.  I am going to start colchicine and Motrin.  I would like to see her back in about 7 days.  She might need follow-up limited Doppler after that lipid infusion.  She knows to go back to the emergency room should she have any worsening symptoms.  Tobacco use: She was educated.     Current medicines are reviewed at length with the patient today.  The patient does not have concerns regarding medicines.  The following changes have been made:  no change  Labs/ tests ordered today include: None  Orders Placed This Encounter  Procedures   EKG 12-Lead     Disposition:   FU with me or APP in about 7 days.     Signed, Rollene Rotunda, MD  10/30/2022 2:34 PM    [facility]

## 2022-10-30 ENCOUNTER — Other Ambulatory Visit (HOSPITAL_COMMUNITY): Payer: Self-pay | Admitting: Internal Medicine

## 2022-10-30 ENCOUNTER — Encounter: Payer: Self-pay | Admitting: Cardiology

## 2022-10-30 ENCOUNTER — Ambulatory Visit (HOSPITAL_BASED_OUTPATIENT_CLINIC_OR_DEPARTMENT_OTHER): Payer: Commercial Managed Care - PPO

## 2022-10-30 ENCOUNTER — Telehealth (HOSPITAL_BASED_OUTPATIENT_CLINIC_OR_DEPARTMENT_OTHER): Payer: Self-pay

## 2022-10-30 ENCOUNTER — Ambulatory Visit: Payer: Commercial Managed Care - PPO | Admitting: Cardiology

## 2022-10-30 ENCOUNTER — Encounter: Payer: Self-pay | Admitting: *Deleted

## 2022-10-30 VITALS — BP 102/68 | HR 114 | Ht 68.0 in | Wt 147.8 lb

## 2022-10-30 DIAGNOSIS — I3139 Other pericardial effusion (noninflammatory): Secondary | ICD-10-CM | POA: Diagnosis not present

## 2022-10-30 LAB — ECHOCARDIOGRAM COMPLETE
Area-P 1/2: 4.39 cm2
S' Lateral: 2.6 cm

## 2022-10-30 MED ORDER — COLCHICINE 0.6 MG PO TABS: 0.6000 mg | ORAL_TABLET | Freq: Two times a day (BID) | ORAL | 0 refills | Status: AC

## 2022-10-30 MED ORDER — IBUPROFEN 600 MG PO TABS: 600.0000 mg | ORAL_TABLET | Freq: Three times a day (TID) | ORAL | 0 refills | Status: AC | PRN

## 2022-10-30 NOTE — Telephone Encounter (Signed)
Pt came to facility to get a work note printed.

## 2022-10-30 NOTE — Patient Instructions (Addendum)
Medication Instructions:   START COLCHICINE 0.6 MG ONE TABLET TWICE DAILY FOR 3 MONTHS  START MOTRIN 600 MG ONE TABLET DAILY FOR 10 DAYS  *If you need a refill on your cardiac medications before your next appointment, please call your pharmacy*   Follow-Up: At [facility], you and your health needs are our priority.  As part of our continuing mission to provide you with exceptional heart care, we have created designated Provider Care Teams.  These Care Teams include your primary Cardiologist (physician) and Advanced Practice Providers (APPs -  Physician Assistants and Nurse Practitioners) who all work together to provide you with the care you need, when you need it.  We recommend signing up for the patient portal called "MyChart".  Sign up information is provided on this After Visit Summary.  MyChart is used to connect with patients for Virtual Visits (Telemedicine).  Patients are able to view lab/test results, encounter notes, upcoming appointments, etc.  Non-urgent messages can be sent to your provider as well.   To learn more about what you can do with MyChart, go to [email or web address].    Your next appointment:   1 week(s)  Provider:   ANY APP     OTC COLACE 500 MG TWICE DAILY

## 2022-10-31 ENCOUNTER — Telehealth: Payer: Self-pay | Admitting: Cardiology

## 2022-10-31 NOTE — Telephone Encounter (Signed)
Patient is calling back for update on letter. Please advise.

## 2022-10-31 NOTE — Telephone Encounter (Signed)
Letter updated and sent to patient via MyChart- patient aware.

## 2022-10-31 NOTE — Progress Notes (Deleted)
Cardiology Office Note:    Date:  10/31/2022   ID:  Caitlin Nichols, DOB 1986-02-15, MRN [id number]  PCP:  Patient, No Pcp Per  Cardiologist:  Rollene Rotunda, MD  Electrophysiologist:  None   Referring MD: No ref. provider found   Chief Complaint: follow-up of pericarditis   History of Present Illness:    Caitlin Nichols is a 37 y.o. female with a history of pericarditis and tobacco abuse who is followed by Dr. Antoine Poche and presents today for follow-up of pericarditis.   Patient was recently referred to Dr. Antoine Poche on 10/30/2022 after being seen in the ED 2 days before with abdominal pain. Work up in the ED showed a normal troponin, slightly elevated BNP of 103.5, elevated Sed Rate of 109,  elevated CRP of 20, WBC of 12, slightly low hemoglobin of 11.7, low potassium of 3, normal LFTs, normal lipase. CT renal stone study was negative. Chest/ abdominal/ pelvic CTA was negative for dissection but showed a moderate pericardial effusion, small bilateral pleural effusion with dependent bibasilar atelectasis, and a small amount of free fluid in the pelvis. Pregnancy test was negative. She was felt to be stable for discharge with outpatient Echo and Cardiology follow-up. Echo on 10/30/2022 showed LVEF of 55-60% with normal wall motion, normal RV, and a small pericardial effusion. She was suspected to have pericarditis and was started on Colchicine 0.6mg  twice daily and Ibuprofen 600mg  once daily for 10 days.   Patient presents today for follow-up. ***  Pericarditis  CTA on 51/2024 showed a moderate pericardial effusion and small bilateral pleural effusions. Sed Rate and CRP were both elevated at 109 and 20 respectively. Echo on 10/30/2022 showed LVEF of 55-60% with normal wall motion, normal RV, and a small pericardial effusion. She was started on Colchicine and Ibuprofen at last visit.  - *** - Continue Colchicine 0.6mg  twice daily for 3 months. - Continue Ibuprofen 600mg  for 10 days.  Tobacco  Abuse ***  Past Medical History:  Diagnosis Date   No pertinent past medical history     No past surgical history on file.  Current Medications: No outpatient medications have been marked as taking for the 11/06/22 encounter (Appointment) with Corrin Parker, PA-C.     Allergies:   Patient has no known allergies.   Social History   Socioeconomic History   Marital status: Single    Spouse name: Not on file   Number of children: Not on file   Years of education: Not on file   Highest education level: Not on file  Occupational History   Not on file  Tobacco Use   Smoking status: Every Day    Packs/day: .5    Types: Cigarettes   Smokeless tobacco: Never  Vaping Use   Vaping Use: Never used  Substance and Sexual Activity   Alcohol use: Yes    Comment: weekly   Drug use: Not Currently    Types: Marijuana   Sexual activity: Not on file  Other Topics Concern   Not on file  Social History Narrative   Lives with two children.  Works [employer].     Social Determinants of Health   Financial Resource Strain: Not on file  Food Insecurity: Not on file  Transportation Needs: Not on file  Physical Activity: Not on file  Stress: Not on file  Social Connections: Not on file     Family History: The patient's family history is not on file.  ROS:   Please see  the history of present illness.     EKGs/Labs/Other Studies Reviewed:    The following studies were reviewed:  Echocardiogram 10/30/2022: Impressions:  1. Left ventricular ejection fraction, by estimation, is 55 to 60%. The  left ventricle has normal function. The left ventricle has no regional  wall motion abnormalities. Left ventricular diastolic parameters were  normal.   2. Right ventricular systolic function is normal. The right ventricular  size is normal.   3. A small pericardial effusion is present. The pericardial effusion is  circumferential. There is no evidence of cardiac tamponade.   4. The  mitral valve is normal in structure. Trivial mitral valve  regurgitation.   5. The aortic valve is tricuspid. Aortic valve regurgitation is not  visualized. No aortic stenosis is present.   6. The inferior vena cava is normal in size with greater than 50%  respiratory variability, suggesting right atrial pressure of 3 mmHg.   Comparison(s): No prior Echocardiogram.    EKG:  EKG not ordered today.   Recent Labs: 10/28/2022: ALT 9; B Natriuretic Peptide 103.5; BUN 7; Creatinine, Ser 0.69; Hemoglobin 11.7; Platelets 265; Potassium 3.0; Sodium 133; TSH 3.118  Recent Lipid Panel No results found for: "CHOL", "TRIG", "HDL", "CHOLHDL", "VLDL", "LDLCALC", "LDLDIRECT"  Physical Exam:    Vital Signs: LMP 10/12/2022 (Exact Date)     Wt Readings from Last 3 Encounters:  10/30/22 147 lb 12.8 oz (67 kg)  10/28/22 156 lb (70.8 kg)  09/25/21 170 lb (77.1 kg)     General: 37 y.o. female in no acute distress. HEENT: Normocephalic and atraumatic. Sclera clear. EOMs intact. Neck: Supple. No carotid bruits. No JVD. Heart: *** RRR. Distinct S1 and S2. No murmurs, gallops, or rubs. Radial and distal pedal pulses 2+ and equal bilaterally. Lungs: No increased work of breathing. Clear to ausculation bilaterally. No wheezes, rhonchi, or rales.  Abdomen: Soft, non-distended, and non-tender to palpation. Bowel sounds present in all 4 quadrants.  MSK: Normal strength and tone for age. *** Extremities: No lower extremity edema.    Skin: Warm and dry. Neuro: Alert and oriented x3. No focal deficits. Psych: Normal affect. Responds appropriately.   Assessment:    No diagnosis found.  Plan:     Disposition: Follow up in ***   Medication Adjustments/Labs and Tests Ordered: Current medicines are reviewed at length with the patient today.  Concerns regarding medicines are outlined above.  No orders of the defined types were placed in this encounter.  No orders of the defined types were placed in this  encounter.   There are no Patient Instructions on file for this visit.   Leanne Lovely, PA-C  10/31/2022 11:49 PM    [facility]

## 2022-10-31 NOTE — Telephone Encounter (Signed)
Patient states she was given a letter for work yesterday stating she is on light duty, but it needs the letter to say that she can't lift over 25lbs.  She would like for another letter to be written saying that.  Please call her when the letter is ready for pick up.

## 2022-10-31 NOTE — Telephone Encounter (Signed)
Patient states the addition to the letter of can't lift over 25 lbs is the only change that needs to be made to the letter.  Please advise

## 2022-11-04 ENCOUNTER — Telehealth: Payer: Self-pay | Admitting: Cardiology

## 2022-11-04 NOTE — Telephone Encounter (Signed)
5/28 FMLA forms faxed into [facility] office - copy of forms put into [facility] Box for review at patient upcoming appt on 5/30.

## 2022-11-06 ENCOUNTER — Ambulatory Visit: Payer: Commercial Managed Care - PPO | Attending: Student | Admitting: Student

## 2022-11-19 NOTE — Telephone Encounter (Signed)
11/19/22  FMLA forms put in Dr.Hochrein box - pt was no show to appt on 5/30 with Marjie Skiff.

## 2023-12-18 ENCOUNTER — Other Ambulatory Visit: Payer: Self-pay

## 2023-12-18 ENCOUNTER — Emergency Department (HOSPITAL_BASED_OUTPATIENT_CLINIC_OR_DEPARTMENT_OTHER): Admission: EM | Admit: 2023-12-18 | Discharge: 2023-12-18 | Disposition: A | Discharge status: Home / Self Care

## 2023-12-18 ENCOUNTER — Other Ambulatory Visit (HOSPITAL_BASED_OUTPATIENT_CLINIC_OR_DEPARTMENT_OTHER): Payer: Self-pay

## 2023-12-18 ENCOUNTER — Encounter (HOSPITAL_BASED_OUTPATIENT_CLINIC_OR_DEPARTMENT_OTHER): Payer: Self-pay

## 2023-12-18 DIAGNOSIS — K047 Periapical abscess without sinus: Secondary | ICD-10-CM | POA: Insufficient documentation

## 2023-12-18 DIAGNOSIS — R220 Localized swelling, mass and lump, head: Secondary | ICD-10-CM | POA: Diagnosis present

## 2023-12-18 MED ORDER — AMOXICILLIN-POT CLAVULANATE 875-125 MG PO TABS
1.0000 | ORAL_TABLET | Freq: Two times a day (BID) | ORAL | 0 refills | Status: AC
  Filled 2023-12-18: qty 13, 7d supply, fill #0

## 2023-12-18 MED ORDER — AMOXICILLIN-POT CLAVULANATE 875-125 MG PO TABS
1.0000 | ORAL_TABLET | Freq: Once | ORAL | Status: AC
  Administered 2023-12-18: 1 via ORAL
  Filled 2023-12-18: qty 1

## 2023-12-18 MED ORDER — IBUPROFEN 400 MG PO TABS
600.0000 mg | ORAL_TABLET | Freq: Once | ORAL | Status: AC
  Administered 2023-12-18: 600 mg via ORAL
  Filled 2023-12-18: qty 1

## 2023-12-18 NOTE — ED Notes (Signed)
 AVS with prescriptions provided to and discussed with patient. Pt verbalizes understanding of discharge instructions and denies any questions or concerns at this time. Pt ambulated out of department independently with steady gait.

## 2023-12-18 NOTE — ED Provider Notes (Signed)
 [facility] Provider Note   CSN: [id number] Arrival date & time: 12/18/23  9164     Patient presents with: Facial Swelling   Caitlin Nichols is a 38 y.o. female who presents with concern for left-sided facial swelling that began this morning.  Reports it is painful and it is difficult to fully open her mouth due to the swelling.  She is able to swallow without difficulty, no difficulty breathing.  Denies any recent dental work, fevers, chills.  Reports she does have a chipped tooth on that side of the mouth.  She has not seen a dentist in many years.   HPI     Prior to Admission medications   Medication Sig Start Date End Date Taking? Authorizing Provider  amoxicillin -clavulanate (AUGMENTIN ) 875-125 MG tablet Take 1 tablet by mouth 2 (two) times daily. 12/18/23  Yes Veta Palma, PA-C  colchicine  0.6 MG tablet Take 1 tablet (0.6 mg total) by mouth 2 (two) times daily. 10/30/22   Lavona Agent, MD  ibuprofen  (ADVIL ) 600 MG tablet Take 1 tablet (600 mg total) by mouth every 8 (eight) hours as needed. 10/30/22   Lavona Agent, MD  pantoprazole  (PROTONIX ) 20 MG tablet Take 1 tablet (20 mg total) by mouth daily. 06/30/21   Ruthell Lonni FALCON, PA-C    Allergies: Patient has no known allergies.    Review of Systems  Constitutional:  Negative for fever.    Updated Vital Signs BP 121/86 (BP Location: Left Arm)   Pulse 86   Temp 98.7 F (37.1 C) (Oral)   Resp 18   Ht 5' 5 (1.651 m)   Wt 69.4 kg   SpO2 100%   BMI 25.46 kg/m   Physical Exam Vitals and nursing note reviewed.  Constitutional:      Appearance: Normal appearance.  HENT:     Head: Atraumatic.     Mouth/Throat:     Comments: Edema overlying the left cheek and lower left mandible  Opens mouth fully, no trismus. Swallowing secretions without difficulty  Poor dentition throughout oral cavity with multiple dental carries. No obvious peritonsillar abscess or dental  abscesses.  Tongue and submandibular space soft and non erythematous, no edema Neck:     Comments: Neck soft and supple, no overlying skin changes Cardiovascular:     Rate and Rhythm: Normal rate and regular rhythm.  Pulmonary:     Effort: Pulmonary effort is normal.  Lymphadenopathy:     Cervical: Cervical adenopathy present.  Neurological:     General: No focal deficit present.     Mental Status: She is alert.  Psychiatric:        Mood and Affect: Mood normal.        Behavior: Behavior normal.     (all labs ordered are listed, but only abnormal results are displayed) Labs Reviewed - No data to display  EKG: None  Radiology: No results found.   Procedures   Medications Ordered in the ED  amoxicillin -clavulanate (AUGMENTIN ) 875-125 MG per tablet 1 tablet (has no administration in time range)  ibuprofen  (ADVIL ) tablet 600 mg (has no administration in time range)                                    Medical Decision Making    Differential diagnosis includes but is not limited to Infection from dental work, dental abscess or infection, ludwig angina, osteomyelitis,  sialadenitis  ED Course:  Upon initial evaluation, patient is well-appearing, no acute distress.  Stable vitals.  Has soft tissue swelling over the left cheek and left mandible.  She is able to open mouth, but with some pain due to the swelling.  Poor dentition throughout the oral cavity.  She is swallowing secretions without difficulty.  No obvious peritonsillar abscess or dental abscess. Neck soft and supple, no overlying skin change, low concern for ludwig angina. She does have a cracked tooth right by the source of swelling, and suspect there may be beginning infection.  Will treat with course of Augmentin . My attending Dr. Neysa also personally saw and evaluated patient and agrees with plan of care  Medications Given: Augmentin  Ibuprofen   Impression: Dental infection  Disposition:  The patient was  discharged home with instructions to take 7 day course of Augmentin  as prescribed.  May take Tylenol  and ibuprofen  as needed for pain.  Follow-up with dentist as soon as possible for further routine care. Return precautions given.    This chart was dictated using voice recognition software, Dragon. Despite the best efforts of this provider to proofread and correct errors, errors may still occur which can change documentation meaning.       Final diagnoses:  Dental infection    ED Discharge Orders          Ordered    amoxicillin -clavulanate (AUGMENTIN ) 875-125 MG tablet  2 times daily        12/18/23 0932               Veta Palma, PA-C 12/18/23 0940    Neysa Caron PARAS, DO 12/18/23 [phone]

## 2023-12-18 NOTE — ED Triage Notes (Signed)
 Patient here POV from Home.  Endorses Pain to left upper dental area from chipped tooth yesterday. Noted Swelling to left facial area that began this AM.  No Fevers, noted a foul taste  NAD noted during Triage. A&Ox4. GCS 15. Ambulatory.

## 2023-12-18 NOTE — Discharge Instructions (Addendum)
 It appears you have a dental infection.  You have been prescribed an antibiotic called Augmentin . Take this antibiotic 2 times a day for the next 7 days.  You were given your first dose here today.  You may take your next dose tonight.  Take the full course of your antibiotic even if you start feeling better. Antibiotics may cause you to have diarrhea.  You may take up to 1000mg  of tylenol  every 6 hours as needed for pain.  Do not take more then 4g per day.  You may use up to 600mg  ibuprofen  every 6 hours as needed for pain.  Do not exceed 2.4g of ibuprofen  per day. You were given your first dose here today.   Please follow-up with your dentist soon as possible for further routine care to prevent this from happening in the future.   Return to the ER for any difficulty swallowing, difficulty opening your mouth, fevers, any other new or concerning symptoms
